# Patient Record
Sex: Female | Born: 1970
Health system: Southern US, Community
[De-identification: ages and names within clinical notes are randomized; demographics above are authoritative.]

## PROBLEM LIST (undated history)

## (undated) DIAGNOSIS — F329 Major depressive disorder, single episode, unspecified: Secondary | ICD-10-CM

## (undated) DIAGNOSIS — F32A Depression, unspecified: Secondary | ICD-10-CM

## (undated) DIAGNOSIS — J439 Emphysema, unspecified: Secondary | ICD-10-CM

## (undated) DIAGNOSIS — F410 Panic disorder [episodic paroxysmal anxiety] without agoraphobia: Secondary | ICD-10-CM

## (undated) DIAGNOSIS — R87629 Unspecified abnormal cytological findings in specimens from vagina: Secondary | ICD-10-CM

## (undated) DIAGNOSIS — F4310 Post-traumatic stress disorder, unspecified: Secondary | ICD-10-CM

## (undated) DIAGNOSIS — Z8619 Personal history of other infectious and parasitic diseases: Secondary | ICD-10-CM

## (undated) DIAGNOSIS — F419 Anxiety disorder, unspecified: Secondary | ICD-10-CM

## (undated) HISTORY — DX: Depression, unspecified: F32.A

## (undated) HISTORY — DX: Panic disorder (episodic paroxysmal anxiety): F41.0

## (undated) HISTORY — DX: Emphysema, unspecified: J43.9

## (undated) HISTORY — PX: NO PAST SURGERIES: SHX2092

## (undated) HISTORY — DX: Major depressive disorder, single episode, unspecified: F32.9

## (undated) HISTORY — DX: Anxiety disorder, unspecified: F41.9

## (undated) HISTORY — DX: Personal history of other infectious and parasitic diseases: Z86.19

## (undated) HISTORY — DX: Post-traumatic stress disorder, unspecified: F43.10

## (undated) HISTORY — DX: Unspecified abnormal cytological findings in specimens from vagina: R87.629

## (undated) HISTORY — PX: LEEP: SHX91

---

## 2016-01-18 ENCOUNTER — Ambulatory Visit: Payer: 59 | Admitting: Licensed Clinical Social Worker

## 2016-02-01 ENCOUNTER — Ambulatory Visit (INDEPENDENT_AMBULATORY_CARE_PROVIDER_SITE_OTHER): Payer: 59 | Admitting: Licensed Clinical Social Worker

## 2016-02-01 DIAGNOSIS — F331 Major depressive disorder, recurrent, moderate: Secondary | ICD-10-CM

## 2016-02-02 ENCOUNTER — Ambulatory Visit (INDEPENDENT_AMBULATORY_CARE_PROVIDER_SITE_OTHER): Payer: 59 | Admitting: Physician Assistant

## 2016-02-02 ENCOUNTER — Encounter: Payer: Self-pay | Admitting: Physician Assistant

## 2016-02-02 VITALS — BP 96/68 | HR 70 | Temp 98.3°F | Resp 16 | Ht 64.25 in | Wt 179.0 lb

## 2016-02-02 DIAGNOSIS — F430 Acute stress reaction: Secondary | ICD-10-CM | POA: Diagnosis not present

## 2016-02-02 MED ORDER — FLUOXETINE HCL 20 MG PO TABS: 20.0000 mg | ORAL_TABLET | Freq: Every day | ORAL | 3 refills | Status: DC

## 2016-02-02 MED ORDER — CLONAZEPAM 0.5 MG PO TABS: 0.5000 mg | ORAL_TABLET | Freq: Two times a day (BID) | ORAL | 1 refills | Status: DC | PRN

## 2016-02-02 NOTE — Progress Notes (Signed)
Patient presents to clinic today to establish care.  Patient with history of anxiety and depression, including a bout with postpartum depression > 15 years ago. Also with history of mild PTSD due to [place]. Is currently seeing a counselor. Has been doing well overall with counseling and without medication until recently. Notes last year losing her job which was hard to deal with. Felt depressed and stayed at home most of the time. Notes a 50 pound weight gain since then. Last month her husband of 22 years left her. Since then, mood has worsened. Is feeling depressed most day. Notes anhedonia and insomnia. Has increased smoking to 2.5-3 packs per day. No excess alcohol consumption. Denies SI/HI.   Past Medical History:  Diagnosis Date  . Anxiety   . Depression   . History of chicken pox   . Panic attacks   . PTSD (post-traumatic stress disorder)     Past Surgical History:  Procedure Laterality Date  . NO PAST SURGERIES      No current outpatient prescriptions on file prior to visit.   No current facility-administered medications on file prior to visit.     Allergies  Allergen Reactions  . Nsaids     Cause GI Issues in large amounts  . Penicillins Rash    Family History  Problem Relation Age of Onset  . Depression Mother     Living  . Diabetes Mother   . Hyperlipidemia Mother   . Hypertension Mother   . Heart attack Father     Living  . Heart disease Father   . Hyperlipidemia Father   . Hypertension Father   . Thyroid disease Sister   . Diabetes Maternal Grandmother   . Alcoholism Maternal Grandfather   . Breast cancer Paternal Grandmother     PGGM-Breast Cancer  . Breast cancer Paternal Aunt   . Heart attack Maternal Uncle   . Stroke Maternal Uncle   . Heart disease Maternal Uncle   . Ulcerative colitis Son     Age 4  . Depression Son     Age 33  . Healthy Neg Hx     Age 34    Social History   Social History    . Marital status: Divorced    Spouse name: N/A  . Number of children: N/A  . Years of education: N/A   Occupational History  . Not on file.   Social History Main Topics  . Smoking status: Current Every Day Smoker    Packs/day: 0.50    Years: 30.00    Types: Cigarettes  . Smokeless tobacco: Never Used  . Alcohol use Yes     Comment: social drinker -- 1-2 beers if out with friends  . Drug use: No  . Sexual activity: Not Currently   Other Topics Concern  . Not on file   Social History Narrative  . No narrative on file   Review of Systems  Constitutional: Negative for fever and weight loss.  HENT: Negative for ear discharge, ear pain, hearing loss and tinnitus.   Eyes: Negative for blurred vision, double vision, photophobia and pain.  Respiratory: Negative for cough and shortness of breath.   Cardiovascular: Negative for chest pain and palpitations.  Gastrointestinal: Negative for abdominal pain, blood in stool, constipation, diarrhea, heartburn, melena, nausea and vomiting.  Genitourinary: Negative for dysuria, flank pain, frequency, hematuria and urgency.  Musculoskeletal: Negative for falls.  Neurological: Negative for dizziness, loss of  consciousness and headaches.  Endo/Heme/Allergies: Negative for environmental allergies.  Psychiatric/Behavioral: Positive for depression. Negative for hallucinations, substance abuse and suicidal ideas. The patient is nervous/anxious. The patient does not have insomnia.     BP 96/68 (BP Location: Left Arm, Patient Position: Sitting, Cuff Size: Large)   Pulse 70   Temp 98.3 F (36.8 C) (Oral)   Resp 16   Ht 5' 4.25" (1.632 m)   Wt 179 lb (81.2 kg)   SpO2 98%   BMI 30.49 kg/m   Physical Exam  Constitutional: She is oriented to person, place, and time and well-developed, well-nourished, and in no distress.  HENT:  Head: Normocephalic.  Eyes: Conjunctivae are normal.  Neck: Neck supple.  Cardiovascular: Normal rate, regular  rhythm, normal heart sounds and intact distal pulses.   Pulmonary/Chest: Effort normal and breath sounds normal. No respiratory distress. She has no wheezes. She has no rales. She exhibits no tenderness.  Neurological: She is alert and oriented to person, place, and time.  Skin: Skin is warm and dry. No rash noted.  Psychiatric: Affect normal.  Vitals reviewed.  Assessment/Plan: Acute stress reaction Will continue counseling. Will begin Klonopin at low dose for panic attack only. Not to be used on a daily basis. Will begin Fluoxetine for ASR. Discussed this will take time to build up in system. ER precautions discussed. Follow-up 1 month.    Leeanne Rio, PA-C

## 2016-02-02 NOTE — Progress Notes (Signed)
Pre visit review using our clinic review tool, if applicable. No additional management support is needed unless otherwise documented below in the visit note/SLS  

## 2016-02-02 NOTE — Assessment & Plan Note (Signed)
Will continue counseling. Will begin Klonopin at low dose for panic attack only. Not to be used on a daily basis. Will begin Fluoxetine for ASR. Discussed this will take time to build up in system. ER precautions discussed. Follow-up 1 month.

## 2016-02-02 NOTE — Patient Instructions (Signed)
Please start the Prozac (Fluoxetine) taking 1/2 tablet daily x 3 days before increasing to 1 tablet daily. The Klonopin is to be used as directed for acute anxiety/panic attack.  Please continue appointments with [facility].  Follow-up with me in 4-6 weeks. If anything worsens while on medication, come see me immediately.

## 2016-02-08 ENCOUNTER — Ambulatory Visit (INDEPENDENT_AMBULATORY_CARE_PROVIDER_SITE_OTHER): Payer: 59 | Admitting: Licensed Clinical Social Worker

## 2016-02-08 DIAGNOSIS — F331 Major depressive disorder, recurrent, moderate: Secondary | ICD-10-CM

## 2016-02-20 ENCOUNTER — Ambulatory Visit (INDEPENDENT_AMBULATORY_CARE_PROVIDER_SITE_OTHER): Payer: 59 | Admitting: Physician Assistant

## 2016-02-20 ENCOUNTER — Encounter: Payer: Self-pay | Admitting: Physician Assistant

## 2016-02-20 VITALS — BP 98/72 | HR 77 | Temp 98.2°F | Resp 16 | Ht 64.25 in | Wt 169.4 lb

## 2016-02-20 DIAGNOSIS — F430 Acute stress reaction: Secondary | ICD-10-CM

## 2016-02-20 LAB — TSH: TSH: 1.1 u[IU]/mL (ref 0.35–4.50)

## 2016-02-20 MED ORDER — DIAZEPAM 10 MG PO TABS: 5.0000 mg | ORAL_TABLET | Freq: Two times a day (BID) | ORAL | 1 refills | Status: DC | PRN

## 2016-02-20 MED ORDER — CITALOPRAM HYDROBROMIDE 20 MG PO TABS: 20.0000 mg | ORAL_TABLET | Freq: Every day | ORAL | 3 refills | Status: DC

## 2016-02-20 NOTE — Patient Instructions (Signed)
Please go to the lab for blood work. I will call with results. Stop the Klonopin and fluoxetine immediately. Start taking the diazepam and citalopram as directed. I do feel that the fluoxetine was contributing to the worsening of her mood. If you do not notice an improvement of this medication, or if you had any recurrence of thoughts of harming yourself, please call 911 or proceed to the nearest emergency room.  I'm taking out of work over the next 2 weeks or you can relax. Stay surrounded by your friends as they seem like a wonderful support system. Heavy work fax over [employer] paperwork/short-term disability paperwork for me to fill out.  Follow-up with me in 2 weeks. Return sooner if needed.

## 2016-02-20 NOTE — Assessment & Plan Note (Signed)
Deteriorated with fluoxetine 20 mg daily. Has had some fleeting suicidal thoughts. Has good support system. No active plan or intent to harm herself. Concern fluoxetine may be contributing. We'll discontinue immediately. Has previously tried and failed Wellbutrin and Effexor with previous medical providers. We'll attempt trial citalopram 20 mg daily. Klonopin discontinued. We'll Rx Valium 10 mg twice daily while we wait for SSRI to reach a therapeutic effect. We'll check her thyroid functions today to make sure is not contributing. Patient has been taken out of work over the next 2 weeks that she can focus on relaxation and appointments with her counselor. Alarm signs and symptoms discussed with patient that would prompt immediate ER evaluation. Patient voices understanding and agreement with plan. Follow-up scheduled for 2 weeks.

## 2016-02-20 NOTE — Progress Notes (Signed)
Patient presents to clinic today for follow-up regarding acute stress reaction. At last visit, patient was started on fluoxetine 20 mg daily. Was also given prescription for Klonopin 0.5 mg to take as needed for acute anxiety only. Patient endorses taking medication as directed. Endorses the Klonopin is subtherapeutic for panic attacks. Endorses taking the Fluoxetine without any effect. Feels that mood has worsened. Having thoughts of harming herself over the past couple of days. Denies plan or intent to harm herself, only fleeting thoughts. Has a family to live for. Feels that medication is contributing to this. Denies thoughts of harming others. Feels that work is an extreme stressor right now as anxiety impacts job performance which is affecting her job security which further worsens her anxiety and mood. Endorses having a great support network of friends.   Past Medical History:  Diagnosis Date  . Anxiety   . Depression   . History of chicken pox   . Panic attacks   . PTSD (post-traumatic stress disorder)     No current outpatient prescriptions on file prior to visit.   No current facility-administered medications on file prior to visit.     Allergies  Allergen Reactions  . Nsaids     Cause GI Issues in large amounts  . Penicillins Rash    Family History  Problem Relation Age of Onset  . Depression Mother     Living  . Diabetes Mother   . Hyperlipidemia Mother   . Hypertension Mother   . Heart attack Father     Living  . Heart disease Father   . Hyperlipidemia Father   . Hypertension Father   . Thyroid disease Sister   . Diabetes Maternal Grandmother   . Alcoholism Maternal Grandfather   . Breast cancer Paternal Grandmother     PGGM-Breast Cancer  . Breast cancer Paternal Aunt   . Heart attack Maternal Uncle   . Stroke Maternal Uncle   . Heart disease Maternal Uncle   . Ulcerative colitis Son     Age 29  . Depression Son     Age 60  . Healthy Neg Hx     Age  27    Social History   Social History  . Marital status: Divorced    Spouse name: N/A  . Number of children: N/A  . Years of education: N/A   Social History Main Topics  . Smoking status: Current Every Day Smoker    Packs/day: 0.50    Years: 30.00    Types: Cigarettes  . Smokeless tobacco: Never Used  . Alcohol use Yes     Comment: social drinker -- 1-2 beers if out with friends  . Drug use: No  . Sexual activity: Not Currently   Other Topics Concern  . None   Social History Narrative  . None   Review of Systems - See HPI.  All other ROS are negative.  BP 98/72 (BP Location: Left Arm, Patient Position: Sitting, Cuff Size: Large)   Pulse 77   Temp 98.2 F (36.8 C) (Oral)   Resp 16   Ht 5' 4.25" (1.632 m)   Wt 169 lb 6 oz (76.8 kg)   SpO2 98%   BMI 28.85 kg/m   Physical Exam  Constitutional: She is oriented to person, place, and time and well-developed, well-nourished, and in no distress.  Eyes: Conjunctivae are normal.  Neck: Neck supple. No thyromegaly present.  Cardiovascular: Normal rate, regular rhythm, normal heart sounds and intact distal  pulses.   Pulmonary/Chest: Effort normal and breath sounds normal. No respiratory distress. She has no wheezes. She has no rales. She exhibits no tenderness.  Neurological: She is alert and oriented to person, place, and time.  Skin: Skin is warm and dry. No rash noted.  Psychiatric: Her mood appears anxious. Her affect is not labile. She exhibits a depressed mood. She expresses suicidal ideation. She expresses no homicidal ideation. She expresses no suicidal plans and no homicidal plans. She exhibits ordered thought content.  Vitals reviewed.  Assessment/Plan: Acute stress reaction Deteriorated with fluoxetine 20 mg daily. Has had some fleeting suicidal thoughts. Has good support system. No active plan or intent to harm herself. Concern fluoxetine may be contributing. We'll discontinue immediately. Has previously tried and  failed Wellbutrin and Effexor with previous medical providers. We'll attempt trial citalopram 20 mg daily. Klonopin discontinued. We'll Rx Valium 10 mg twice daily while we wait for SSRI to reach a therapeutic effect. We'll check her thyroid functions today to make sure is not contributing. Patient has been taken out of work over the next 2 weeks that she can focus on relaxation and appointments with her counselor. Alarm signs and symptoms discussed with patient that would prompt immediate ER evaluation. Patient voices understanding and agreement with plan. Follow-up scheduled for 2 weeks.    Leeanne Rio, PA-C

## 2016-02-20 NOTE — Addendum Note (Signed)
Addended by: Raiford Noble on: 02/20/2016 10:02 AM   Modules accepted: Orders

## 2016-02-22 ENCOUNTER — Ambulatory Visit (INDEPENDENT_AMBULATORY_CARE_PROVIDER_SITE_OTHER): Payer: 59 | Admitting: Licensed Clinical Social Worker

## 2016-02-22 DIAGNOSIS — F331 Major depressive disorder, recurrent, moderate: Secondary | ICD-10-CM

## 2016-02-27 ENCOUNTER — Telehealth: Payer: Self-pay | Admitting: Physician Assistant

## 2016-02-28 NOTE — Telephone Encounter (Signed)
Spoke with Angie [paperwork] to inquire about this issue; she stated that all paperwork regarding FMLA/Disability is forwarded straight to [place], clinic lead], and upon speaking with Ashlee about this matter, they cannot locate patient's paperwork. Due to the Urgency of this matter, I have spoken with patient to ask if she can contact her Insurance facilitator and request to have another copy for PCP and Counselor [Julie] faxed to me in the clinic at [phone], so that we may get this matter completed in a timely manner. Patient stated that her Insurance, [employer], reports that they have called our office several times about this matter without response; apologized to patient and reiterated to have the faxes sent to the clinic at me Attention and will will take care of our part of the paperwork needed, and that I will forward Julie's to her box in front office with a note of the urgency on it/SLS 08/23

## 2016-02-28 NOTE — Telephone Encounter (Signed)
Personally took paperwork off of the fax machine. Did not see any documents for Hannah Keith to fill out -- We need to keep a check on this.  I did fill out all of the forms addressed to me.  I have faxed in to [facility] and have spoken with the person handling her case so they are aware fax should be delivered here shortly.   Patient notified.  Please check with Hannah Keith to see if she has received any forms.

## 2016-02-28 NOTE — Telephone Encounter (Signed)
Pt came in the office to follow up on her FMLA paperwork, pt states that insurance company informed her that the paperwork was faxed to [place] and [facility] last week, states that they informed her that there was never a response from our office. Pt states that if the paperwork is not faxed back by Friday of this week, the claim will be denied and she will not get paid for 2 weeks. Pt would like a call back regarding this matter as soon as possible

## 2016-03-05 ENCOUNTER — Encounter: Payer: Self-pay | Admitting: Physician Assistant

## 2016-03-05 ENCOUNTER — Ambulatory Visit (INDEPENDENT_AMBULATORY_CARE_PROVIDER_SITE_OTHER): Payer: 59 | Admitting: Physician Assistant

## 2016-03-05 DIAGNOSIS — F430 Acute stress reaction: Secondary | ICD-10-CM

## 2016-03-05 NOTE — Progress Notes (Signed)
Patient presents to clinic today for follow-up of acute stress reaction after beginning Citalopram 20 mg daily. Patient endorses taking medication as directed. Endorses improvement in her overall mood. Endorses anxiety is still present but is mostly manageable. Frequency of panic attacks is decreased. Panic attacks are well managed with diet. Main concern is sleep at present. Patient is having a hard time staying asleep. Once to discuss options, but does not want to start any medication if possible. Patient also concerned about social stressors as her return to work date is tomorrow. Is wanting to discuss potential to extend her disability period.   Past Medical History:  Diagnosis Date  . Anxiety   . Depression   . History of chicken pox   . Panic attacks   . PTSD (post-traumatic stress disorder)     Current Outpatient Prescriptions on File Prior to Visit  Medication Sig Dispense Refill  . citalopram (CELEXA) 20 MG tablet Take 1 tablet (20 mg total) by mouth daily. 30 tablet 3  . diazepam (VALIUM) 10 MG tablet Take 0.5-1 tablets (5-10 mg total) by mouth every 12 (twelve) hours as needed for anxiety. 60 tablet 1   No current facility-administered medications on file prior to visit.     Allergies  Allergen Reactions  . Nsaids     Cause GI Issues in large amounts  . Penicillins Rash    Family History  Problem Relation Age of Onset  . Depression Mother     Living  . Diabetes Mother   . Hyperlipidemia Mother   . Hypertension Mother   . Heart attack Father     Living  . Heart disease Father   . Hyperlipidemia Father   . Hypertension Father   . Thyroid disease Sister   . Diabetes Maternal Grandmother   . Alcoholism Maternal Grandfather   . Breast cancer Paternal Grandmother     PGGM-Breast Cancer  . Breast cancer Paternal Aunt   . Heart attack Maternal Uncle   . Stroke Maternal Uncle   . Heart disease Maternal Uncle   . Ulcerative colitis Son     Age 46  . Depression  Son     Age 53  . Healthy Neg Hx     Age 4    Social History   Social History  . Marital status: Divorced    Spouse name: N/A  . Number of children: N/A  . Years of education: N/A   Social History Main Topics  . Smoking status: Current Every Day Smoker    Packs/day: 0.50    Years: 30.00    Types: Cigarettes  . Smokeless tobacco: Never Used  . Alcohol use Yes     Comment: social drinker -- 1-2 beers if out with friends  . Drug use: No  . Sexual activity: Not Currently   Other Topics Concern  . None   Social History Narrative  . None   Review of Systems - See HPI.  All other ROS are negative.  BP 98/60 (BP Location: Left Arm, Patient Position: Sitting, Cuff Size: Large)   Pulse 76   Temp 98.2 F (36.8 C) (Oral)   Resp 16   Ht 5\' 4"  (1.626 m)   Wt 164 lb 8 oz (74.6 kg)   SpO2 98%   BMI 28.24 kg/m   Physical Exam  Constitutional: She is oriented to person, place, and time and well-developed, well-nourished, and in no distress.  Cardiovascular: Normal rate, regular rhythm, normal heart sounds and intact  distal pulses.   Pulmonary/Chest: Effort normal.  Neurological: She is alert and oriented to person, place, and time.  Skin: Skin is warm and dry. No rash noted.  Psychiatric: Affect normal.  Vitals reviewed.   Recent Results (from the past 2160 hour(s))  TSH     Status: None   Collection Time: 02/20/16 10:02 AM  Result Value Ref Range   TSH 1.10 0.35 - 4.50 uIU/mL   Assessment/Plan: Acute stress reaction Is doing much better after switch from Prozac to citalopram. Has been on citalopram 2 weeks with noted improvement. Will continue same dose for now. Continue Valium as directed when needed for acute anxiety. Continue counseling. Will start melatonin nightly to help with sleep. Sleep hygiene and exercise recommendations reviewed with patient. Patient has scheduled physical in 2 weeks. I have agreed to extend her short-term disability leave for one more week,  to allow time for further symptom improvement before releasing her back to stressors at work    [employer], Sandria Manly, PA-C

## 2016-03-05 NOTE — Assessment & Plan Note (Addendum)
Is doing much better after switch from Prozac to citalopram. Has been on citalopram 2 weeks with noted improvement. Will continue same dose for now. Continue Valium as directed when needed for acute anxiety. Continue counseling. Will start melatonin nightly to help with sleep. Sleep hygiene and exercise recommendations reviewed with patient. Patient has scheduled physical in 2 weeks. I have agreed to extend her short-term disability leave for one more week, to allow time for further symptom improvement before releasing her back to stressors at work. Will extend leave 1 more week with return to work date now 03/13/16. FU scheduled for 03/15/16

## 2016-03-05 NOTE — Patient Instructions (Signed)
Please continue Citalopram as directed. Use Valium when needed for panic attack. Start an over-the-counter Melatonin supplement 3-5 mg nightly to help with sleep. Increase evening exercise to promote restful sleep. Start a daily Gatorade.  I will see you at your physical.

## 2016-03-07 ENCOUNTER — Ambulatory Visit (INDEPENDENT_AMBULATORY_CARE_PROVIDER_SITE_OTHER): Payer: 59 | Admitting: Licensed Clinical Social Worker

## 2016-03-07 DIAGNOSIS — F331 Major depressive disorder, recurrent, moderate: Secondary | ICD-10-CM

## 2016-03-14 NOTE — Telephone Encounter (Addendum)
Spoke with Hannah Keith and she stated that she has seen the pt and the pt informed her that the paperwork that [place] filled out for her was enough.  So Hannah Keith will not need to fill out any forms per the pt.//AB/CMA

## 2016-03-15 ENCOUNTER — Encounter: Payer: Self-pay | Admitting: Physician Assistant

## 2016-03-15 ENCOUNTER — Ambulatory Visit (INDEPENDENT_AMBULATORY_CARE_PROVIDER_SITE_OTHER): Payer: 59 | Admitting: Physician Assistant

## 2016-03-15 VITALS — BP 92/56 | HR 67 | Temp 98.3°F | Resp 16 | Ht 64.0 in | Wt 164.1 lb

## 2016-03-15 DIAGNOSIS — Z1239 Encounter for other screening for malignant neoplasm of breast: Secondary | ICD-10-CM | POA: Diagnosis not present

## 2016-03-15 DIAGNOSIS — Z23 Encounter for immunization: Secondary | ICD-10-CM

## 2016-03-15 DIAGNOSIS — R42 Dizziness and giddiness: Secondary | ICD-10-CM | POA: Diagnosis not present

## 2016-03-15 DIAGNOSIS — Z0000 Encounter for general adult medical examination without abnormal findings: Secondary | ICD-10-CM

## 2016-03-15 DIAGNOSIS — F430 Acute stress reaction: Secondary | ICD-10-CM

## 2016-03-15 DIAGNOSIS — Z01419 Encounter for gynecological examination (general) (routine) without abnormal findings: Secondary | ICD-10-CM | POA: Diagnosis not present

## 2016-03-15 LAB — URINALYSIS, ROUTINE W REFLEX MICROSCOPIC
Bilirubin Urine: NEGATIVE
HGB URINE DIPSTICK: NEGATIVE
Ketones, ur: NEGATIVE
Leukocytes, UA: NEGATIVE
NITRITE: NEGATIVE
RBC / HPF: NONE SEEN (ref 0–?)
Specific Gravity, Urine: <=1.005 — AB (ref 1.000–1.030)
Total Protein, Urine: NEGATIVE
Urine Glucose: NEGATIVE
Urobilinogen, UA: 0.2 (ref 0.0–1.0)
WBC UA: NONE SEEN (ref 0–?)
pH: 6.5 (ref 5.0–8.0)

## 2016-03-15 LAB — COMPREHENSIVE METABOLIC PANEL
ALT: 14 U/L (ref 0–35)
AST: 11 U/L (ref 0–37)
Albumin: 4 g/dL (ref 3.5–5.2)
Alkaline Phosphatase: 62 U/L (ref 39–117)
BILIRUBIN TOTAL: 0.5 mg/dL (ref 0.2–1.2)
BUN: 7 mg/dL (ref 6–23)
CALCIUM: 9.1 mg/dL (ref 8.4–10.5)
CHLORIDE: 108 meq/L (ref 96–112)
CO2: 29 meq/L (ref 19–32)
Creatinine, Ser: 0.81 mg/dL (ref 0.40–1.20)
GFR: 80.45 mL/min (ref >60.00)
Glucose, Bld: 101 mg/dL — ABNORMAL HIGH (ref 70–99)
Potassium: 4 mEq/L (ref 3.5–5.1)
Sodium: 141 mEq/L (ref 135–145)
Total Protein: 6.5 g/dL (ref 6.0–8.3)

## 2016-03-15 LAB — LIPID PANEL
CHOLESTEROL: 165 mg/dL (ref 0–200)
HDL: 40 mg/dL (ref >39.00)
LDL CALC: 109 mg/dL — AB (ref 0–99)
NonHDL: 124.69
TRIGLYCERIDES: 76 mg/dL (ref 0.0–149.0)
Total CHOL/HDL Ratio: 4
VLDL: 15.2 mg/dL (ref 0.0–40.0)

## 2016-03-15 LAB — HEMOGLOBIN A1C: Hgb A1c MFr Bld: 5.7 % (ref 4.6–6.5)

## 2016-03-15 LAB — CBC
HCT: 43.7 % (ref 36.0–46.0)
HEMOGLOBIN: 14.7 g/dL (ref 12.0–15.0)
MCHC: 33.6 g/dL (ref 30.0–36.0)
MCV: 93.2 fl (ref 78.0–100.0)
PLATELETS: 220 10*3/uL (ref 150.0–400.0)
RBC: 4.69 Mil/uL (ref 3.87–5.11)
RDW: 15.7 % — ABNORMAL HIGH (ref 11.5–15.5)
WBC: 6.4 10*3/uL (ref 4.0–10.5)

## 2016-03-15 MED ORDER — CITALOPRAM HYDROBROMIDE 40 MG PO TABS: 40.0000 mg | ORAL_TABLET | Freq: Every day | ORAL | 3 refills | Status: DC

## 2016-03-15 MED ORDER — CLONAZEPAM 0.5 MG PO TABS: 0.5000 mg | ORAL_TABLET | Freq: Two times a day (BID) | ORAL | 1 refills | Status: DC | PRN

## 2016-03-15 NOTE — Assessment & Plan Note (Signed)
Referral to GYN placed for routine care.

## 2016-03-15 NOTE — Assessment & Plan Note (Signed)
Will increase Citalopram to 40 mg daily. Will d/c Valium and start Klonopin for acute anxiety. FU 3 months. Will work on intermittent FMLA.

## 2016-03-15 NOTE — Assessment & Plan Note (Signed)
Depression screen negative. Health Maintenance reviewed -- Flu shot updated. She defers TD today. Referral to GYN for routine pelvic exam and pap smear.  Preventive schedule discussed and handout given in AVS. Will obtain fasting labs today.

## 2016-03-15 NOTE — Patient Instructions (Signed)
Please start the new dose of Citalopram. Stop the Valium. Use Klonopin as directed if needed for panic attack.  Follow-up with me in 3 months.  Please go to the lab for blood work.   Our office will call you with your results unless you have chosen to receive results via MyChart.  If your blood work is normal we will follow-up each year for physicals and as scheduled for chronic medical problems.  If anything is abnormal we will treat accordingly and get you in for a follow-up.   Preventive Care for Adults, Female A healthy lifestyle and preventive care can promote health and wellness. Preventive health guidelines for women include the following key practices.  A routine yearly physical is a good way to check with your health care provider about your health and preventive screening. It is a chance to share any concerns and updates on your health and to receive a thorough exam.  Visit your dentist for a routine exam and preventive care every 6 months. Brush your teeth twice a day and floss once a day. Good oral hygiene prevents tooth decay and gum disease.  The frequency of eye exams is based on your age, health, family medical history, use of contact lenses, and other factors. Follow your health care provider's recommendations for frequency of eye exams.  Eat a healthy diet. Foods like vegetables, fruits, whole grains, low-fat dairy products, and lean protein foods contain the nutrients you need without too many calories. Decrease your intake of foods high in solid fats, added sugars, and salt. Eat the right amount of calories for you.Get information about a proper diet from your health care provider, if necessary.  Regular physical exercise is one of the most important things you can do for your health. Most adults should get at least 150 minutes of moderate-intensity exercise (any activity that increases your heart rate and causes you to sweat) each week. In addition, most adults need  muscle-strengthening exercises on 2 or more days a week.  Maintain a healthy weight. The body mass index (BMI) is a screening tool to identify possible weight problems. It provides an estimate of body fat based on height and weight. Your health care provider can find your BMI and can help you achieve or maintain a healthy weight.For adults 20 years and older:  A BMI below 18.5 is considered underweight.  A BMI of 18.5 to 24.9 is normal.  A BMI of 25 to 29.9 is considered overweight.  A BMI of 30 and above is considered obese.  Maintain normal blood lipids and cholesterol levels by exercising and minimizing your intake of saturated fat. Eat a balanced diet with plenty of fruit and vegetables. Blood tests for lipids and cholesterol should begin at age 36 and be repeated every 5 years. If your lipid or cholesterol levels are high, you are over 50, or you are at high risk for heart disease, you may need your cholesterol levels checked more frequently.Ongoing high lipid and cholesterol levels should be treated with medicines if diet and exercise are not working.  If you smoke, find out from your health care provider how to quit. If you do not use tobacco, do not start.  Lung cancer screening is recommended for adults aged 12-80 years who are at high risk for developing lung cancer because of a history of smoking. A yearly low-dose CT scan of the lungs is recommended for people who have at least a 30-pack-year history of smoking and are a current  smoker or have quit within the past 15 years. A pack year of smoking is smoking an average of 1 pack of cigarettes a day for 1 year (for example: 1 pack a day for 30 years or 2 packs a day for 15 years). Yearly screening should continue until the smoker has stopped smoking for at least 15 years. Yearly screening should be stopped for people who develop a health problem that would prevent them from having lung cancer treatment.  If you are pregnant, do not  drink alcohol. If you are breastfeeding, be very cautious about drinking alcohol. If you are not pregnant and choose to drink alcohol, do not have more than 1 drink per day. One drink is considered to be 12 ounces (355 mL) of beer, 5 ounces (148 mL) of wine, or 1.5 ounces (44 mL) of liquor.  Avoid use of street drugs. Do not share needles with anyone. Ask for help if you need support or instructions about stopping the use of drugs.  High blood pressure causes heart disease and increases the risk of stroke. Your blood pressure should be checked at least every 1 to 2 years. Ongoing high blood pressure should be treated with medicines if weight loss and exercise do not work.  If you are 4-54 years old, ask your health care provider if you should take aspirin to prevent strokes.  Diabetes screening is done by taking a blood sample to check your blood glucose level after you have not eaten for a certain period of time (fasting). If you are not overweight and you do not have risk factors for diabetes, you should be screened once every 3 years starting at age 34. If you are overweight or obese and you are 58-66 years of age, you should be screened for diabetes every year as part of your cardiovascular risk assessment.  Breast cancer screening is essential preventive care for women. You should practice "breast self-awareness." This means understanding the normal appearance and feel of your breasts and may include breast self-examination. Any changes detected, no matter how small, should be reported to a health care provider. Women in their 73s and 30s should have a clinical breast exam (CBE) by a health care provider as part of a regular health exam every 1 to 3 years. After age 61, women should have a CBE every year. Starting at age 64, women should consider having a mammogram (breast X-ray test) every year. Women who have a family history of breast cancer should talk to their health care provider about genetic  screening. Women at a high risk of breast cancer should talk to their health care providers about having an MRI and a mammogram every year.  Breast cancer gene (BRCA)-related cancer risk assessment is recommended for women who have family members with BRCA-related cancers. BRCA-related cancers include breast, ovarian, tubal, and peritoneal cancers. Having family members with these cancers may be associated with an increased risk for harmful changes (mutations) in the breast cancer genes BRCA1 and BRCA2. Results of the assessment will determine the need for genetic counseling and BRCA1 and BRCA2 testing.  Your health care provider may recommend that you be screened regularly for cancer of the pelvic organs (ovaries, uterus, and vagina). This screening involves a pelvic examination, including checking for microscopic changes to the surface of your cervix (Pap test). You may be encouraged to have this screening done every 3 years, beginning at age 71.  For women ages 80-65, health care providers may recommend pelvic exams  and Pap testing every 3 years, or they may recommend the Pap and pelvic exam, combined with testing for human papilloma virus (HPV), every 5 years. Some types of HPV increase your risk of cervical cancer. Testing for HPV may also be done on women of any age with unclear Pap test results.  Other health care providers may not recommend any screening for nonpregnant women who are considered low risk for pelvic cancer and who do not have symptoms. Ask your health care provider if a screening pelvic exam is right for you.  If you have had past treatment for cervical cancer or a condition that could lead to cancer, you need Pap tests and screening for cancer for at least 20 years after your treatment. If Pap tests have been discontinued, your risk factors (such as having a new sexual partner) need to be reassessed to determine if screening should resume. Some women have medical problems that  increase the chance of getting cervical cancer. In these cases, your health care provider may recommend more frequent screening and Pap tests.  Colorectal cancer can be detected and often prevented. Most routine colorectal cancer screening begins at the age of 21 years and continues through age 38 years. However, your health care provider may recommend screening at an earlier age if you have risk factors for colon cancer. On a yearly basis, your health care provider may provide home test kits to check for hidden blood in the stool. Use of a small camera at the end of a tube, to directly examine the colon (sigmoidoscopy or colonoscopy), can detect the earliest forms of colorectal cancer. Talk to your health care provider about this at age 56, when routine screening begins. Direct exam of the colon should be repeated every 5-10 years through age 83 years, unless early forms of precancerous polyps or small growths are found.  People who are at an increased risk for hepatitis B should be screened for this virus. You are considered at high risk for hepatitis B if:  You were born in a country where hepatitis B occurs often. Talk with your health care provider about which countries are considered high risk.  Your parents were born in a high-risk country and you have not received a shot to protect against hepatitis B (hepatitis B vaccine).  You have HIV or AIDS.  You use needles to inject street drugs.  You live with, or have sex with, someone who has hepatitis B.  You get hemodialysis treatment.  You take certain medicines for conditions like cancer, organ transplantation, and autoimmune conditions.  Hepatitis C blood testing is recommended for all people born from [birth year] through 1967 and any individual with known risks for hepatitis C.  Practice safe sex. Use condoms and avoid high-risk sexual practices to reduce the spread of sexually transmitted infections (STIs). STIs include gonorrhea, chlamydia,  syphilis, trichomonas, herpes, HPV, and human immunodeficiency virus (HIV). Herpes, HIV, and HPV are viral illnesses that have no cure. They can result in disability, cancer, and death.  You should be screened for sexually transmitted illnesses (STIs) including gonorrhea and chlamydia if:  You are sexually active and are younger than 24 years.  You are older than 24 years and your health care provider tells you that you are at risk for this type of infection.  Your sexual activity has changed since you were last screened and you are at an increased risk for chlamydia or gonorrhea. Ask your health care provider if you are at risk.  If you are at risk of being infected with HIV, it is recommended that you take a prescription medicine daily to prevent HIV infection. This is called preexposure prophylaxis (PrEP). You are considered at risk if:  You are sexually active and do not regularly use condoms or know the HIV status of your partner(s).  You take drugs by injection.  You are sexually active with a partner who has HIV.  Talk with your health care provider about whether you are at high risk of being infected with HIV. If you choose to begin PrEP, you should first be tested for HIV. You should then be tested every 3 months for as long as you are taking PrEP.  Osteoporosis is a disease in which the bones lose minerals and strength with aging. This can result in serious bone fractures or breaks. The risk of osteoporosis can be identified using a bone density scan. Women ages 66 years and over and women at risk for fractures or osteoporosis should discuss screening with their health care providers. Ask your health care provider whether you should take a calcium supplement or vitamin D to reduce the rate of osteoporosis.  Menopause can be associated with physical symptoms and risks. Hormone replacement therapy is available to decrease symptoms and risks. You should talk to your health care provider  about whether hormone replacement therapy is right for you.  Use sunscreen. Apply sunscreen liberally and repeatedly throughout the day. You should seek shade when your shadow is shorter than you. Protect yourself by wearing long sleeves, pants, a wide-brimmed hat, and sunglasses year round, whenever you are outdoors.  Once a month, do a whole body skin exam, using a mirror to look at the skin on your back. Tell your health care provider of new moles, moles that have irregular borders, moles that are larger than a pencil eraser, or moles that have changed in shape or color.  Stay current with required vaccines (immunizations).  Influenza vaccine. All adults should be immunized every year.  Tetanus, diphtheria, and acellular pertussis (Td, Tdap) vaccine. Pregnant women should receive 1 dose of Tdap vaccine during each pregnancy. The dose should be obtained regardless of the length of time since the last dose. Immunization is preferred during the 27th-36th week of gestation. An adult who has not previously received Tdap or who does not know her vaccine status should receive 1 dose of Tdap. This initial dose should be followed by tetanus and diphtheria toxoids (Td) booster doses every 10 years. Adults with an unknown or incomplete history of completing a 3-dose immunization series with Td-containing vaccines should begin or complete a primary immunization series including a Tdap dose. Adults should receive a Td booster every 10 years.  Varicella vaccine. An adult without evidence of immunity to varicella should receive 2 doses or a second dose if she has previously received 1 dose. Pregnant females who do not have evidence of immunity should receive the first dose after pregnancy. This first dose should be obtained before leaving the health care facility. The second dose should be obtained 4-8 weeks after the first dose.  Human papillomavirus (HPV) vaccine. Females aged 13-26 years who have not received  the vaccine previously should obtain the 3-dose series. The vaccine is not recommended for use in pregnant females. However, pregnancy testing is not needed before receiving a dose. If a female is found to be pregnant after receiving a dose, no treatment is needed. In that case, the remaining doses should be delayed until  after the pregnancy. Immunization is recommended for any person with an immunocompromised condition through the age of 29 years if she did not get any or all doses earlier. During the 3-dose series, the second dose should be obtained 4-8 weeks after the first dose. The third dose should be obtained 24 weeks after the first dose and 16 weeks after the second dose.  Zoster vaccine. One dose is recommended for adults aged 57 years or older unless certain conditions are present.  Measles, mumps, and rubella (MMR) vaccine. Adults born before 2 generally are considered immune to measles and mumps. Adults born in [birth year] or later should have 1 or more doses of MMR vaccine unless there is a contraindication to the vaccine or there is laboratory evidence of immunity to each of the three diseases. A routine second dose of MMR vaccine should be obtained at least 28 days after the first dose for students attending postsecondary schools, health care workers, or international travelers. People who received inactivated measles vaccine or an unknown type of measles vaccine during 1963-1967 should receive 2 doses of MMR vaccine. People who received inactivated mumps vaccine or an unknown type of mumps vaccine before 1979 and are at high risk for mumps infection should consider immunization with 2 doses of MMR vaccine. For females of childbearing age, rubella immunity should be determined. If there is no evidence of immunity, females who are not pregnant should be vaccinated. If there is no evidence of immunity, females who are pregnant should delay immunization until after pregnancy. Unvaccinated health care  workers born before 45 who lack laboratory evidence of measles, mumps, or rubella immunity or laboratory confirmation of disease should consider measles and mumps immunization with 2 doses of MMR vaccine or rubella immunization with 1 dose of MMR vaccine.  Pneumococcal 13-valent conjugate (PCV13) vaccine. When indicated, a person who is uncertain of his immunization history and has no record of immunization should receive the PCV13 vaccine. All adults 26 years of age and older should receive this vaccine. An adult aged 45 years or older who has certain medical conditions and has not been previously immunized should receive 1 dose of PCV13 vaccine. This PCV13 should be followed with a dose of pneumococcal polysaccharide (PPSV23) vaccine. Adults who are at high risk for pneumococcal disease should obtain the PPSV23 vaccine at least 8 weeks after the dose of PCV13 vaccine. Adults older than 47 years of age who have normal immune system function should obtain the PPSV23 vaccine dose at least 1 year after the dose of PCV13 vaccine.  Pneumococcal polysaccharide (PPSV23) vaccine. When PCV13 is also indicated, PCV13 should be obtained first. All adults aged 3 years and older should be immunized. An adult younger than age 18 years who has certain medical conditions should be immunized. Any person who resides in a nursing home or long-term care facility should be immunized. An adult smoker should be immunized. People with an immunocompromised condition and certain other conditions should receive both PCV13 and PPSV23 vaccines. People with human immunodeficiency virus (HIV) infection should be immunized as soon as possible after diagnosis. Immunization during chemotherapy or radiation therapy should be avoided. Routine use of PPSV23 vaccine is not recommended for American Indians, [place] Natives, or people younger than 65 years unless there are medical conditions that require PPSV23 vaccine. When indicated, people who  have unknown immunization and have no record of immunization should receive PPSV23 vaccine. One-time revaccination 5 years after the first dose of PPSV23 is recommended for people  aged 19-64 years who have chronic kidney failure, nephrotic syndrome, asplenia, or immunocompromised conditions. People who received 1-2 doses of PPSV23 before age 74 years should receive another dose of PPSV23 vaccine at age 35 years or later if at least 5 years have passed since the previous dose. Doses of PPSV23 are not needed for people immunized with PPSV23 at or after age 74 years.  Meningococcal vaccine. Adults with asplenia or persistent complement component deficiencies should receive 2 doses of quadrivalent meningococcal conjugate (MenACWY-D) vaccine. The doses should be obtained at least 2 months apart. Microbiologists working with certain meningococcal bacteria, Farmers recruits, people at risk during an outbreak, and people who travel to or live in countries with a high rate of meningitis should be immunized. A first-year college student up through age 49 years who is living in a residence hall should receive a dose if she did not receive a dose on or after her 16th birthday. Adults who have certain high-risk conditions should receive one or more doses of vaccine.  Hepatitis A vaccine. Adults who wish to be protected from this disease, have certain high-risk conditions, work with hepatitis A-infected animals, work in hepatitis A research labs, or travel to or work in countries with a high rate of hepatitis A should be immunized. Adults who were previously unvaccinated and who anticipate close contact with an international adoptee during the first 60 days after arrival in the [place] from a country with a high rate of hepatitis A should be immunized.  Hepatitis B vaccine. Adults who wish to be protected from this disease, have certain high-risk conditions, may be exposed to blood or other infectious body fluids,  are household contacts or sex partners of hepatitis B positive people, are clients or workers in certain care facilities, or travel to or work in countries with a high rate of hepatitis B should be immunized.  Haemophilus influenzae type b (Hib) vaccine. A previously unvaccinated person with asplenia or sickle cell disease or having a scheduled splenectomy should receive 1 dose of Hib vaccine. Regardless of previous immunization, a recipient of a hematopoietic stem cell transplant should receive a 3-dose series 6-12 months after her successful transplant. Hib vaccine is not recommended for adults with HIV infection. Preventive Services / Frequency Ages 44 to 21 years  Blood pressure check.** / Every 3-5 years.  Lipid and cholesterol check.** / Every 5 years beginning at age 5.  Clinical breast exam.** / Every 3 years for women in their 31s and 70s.  BRCA-related cancer risk assessment.** / For women who have family members with a BRCA-related cancer (breast, ovarian, tubal, or peritoneal cancers).  Pap test.** / Every 2 years from ages 73 through 64. Every 3 years starting at age 24 through age 24 or 39 with a history of 3 consecutive normal Pap tests.  HPV screening.** / Every 3 years from ages 110 through ages 31 to 20 with a history of 3 consecutive normal Pap tests.  Hepatitis C blood test.** / For any individual with known risks for hepatitis C.  Skin self-exam. / Monthly.  Influenza vaccine. / Every year.  Tetanus, diphtheria, and acellular pertussis (Tdap, Td) vaccine.** / Consult your health care provider. Pregnant women should receive 1 dose of Tdap vaccine during each pregnancy. 1 dose of Td every 10 years.  Varicella vaccine.** / Consult your health care provider. Pregnant females who do not have evidence of immunity should receive the first dose after pregnancy.  HPV vaccine. / 3 doses  over 6 months, if 88 and younger. The vaccine is not recommended for use in pregnant  females. However, pregnancy testing is not needed before receiving a dose.  Measles, mumps, rubella (MMR) vaccine.** / You need at least 1 dose of MMR if you were born in 1957 or later. You may also need a 2nd dose. For females of childbearing age, rubella immunity should be determined. If there is no evidence of immunity, females who are not pregnant should be vaccinated. If there is no evidence of immunity, females who are pregnant should delay immunization until after pregnancy.  Pneumococcal 13-valent conjugate (PCV13) vaccine.** / Consult your health care provider.  Pneumococcal polysaccharide (PPSV23) vaccine.** / 1 to 2 doses if you smoke cigarettes or if you have certain conditions.  Meningococcal vaccine.** / 1 dose if you are age 16 to 89 years and a Market researcher living in a residence hall, or have one of several medical conditions, you need to get vaccinated against meningococcal disease. You may also need additional booster doses.  Hepatitis A vaccine.** / Consult your health care provider.  Hepatitis B vaccine.** / Consult your health care provider.  Haemophilus influenzae type b (Hib) vaccine.** / Consult your health care provider. Ages 10 to 28 years  Blood pressure check.** / Every year.  Lipid and cholesterol check.** / Every 5 years beginning at age 22 years.  Lung cancer screening. / Every year if you are aged 54-80 years and have a 30-pack-year history of smoking and currently smoke or have quit within the past 15 years. Yearly screening is stopped once you have quit smoking for at least 15 years or develop a health problem that would prevent you from having lung cancer treatment.  Clinical breast exam.** / Every year after age 23 years.  BRCA-related cancer risk assessment.** / For women who have family members with a BRCA-related cancer (breast, ovarian, tubal, or peritoneal cancers).  Mammogram.** / Every year beginning at age 56 years and continuing  for as long as you are in good health. Consult with your health care provider.  Pap test.** / Every 3 years starting at age 10 years through age 20 or 14 years with a history of 3 consecutive normal Pap tests.  HPV screening.** / Every 3 years from ages 16 years through ages 8 to 13 years with a history of 3 consecutive normal Pap tests.  Fecal occult blood test (FOBT) of stool. / Every year beginning at age 94 years and continuing until age 58 years. You may not need to do this test if you get a colonoscopy every 10 years.  Flexible sigmoidoscopy or colonoscopy.** / Every 5 years for a flexible sigmoidoscopy or every 10 years for a colonoscopy beginning at age 11 years and continuing until age 72 years.  Hepatitis C blood test.** / For all people born from [birth year] through 1965 and any individual with known risks for hepatitis C.  Skin self-exam. / Monthly.  Influenza vaccine. / Every year.  Tetanus, diphtheria, and acellular pertussis (Tdap/Td) vaccine.** / Consult your health care provider. Pregnant women should receive 1 dose of Tdap vaccine during each pregnancy. 1 dose of Td every 10 years.  Varicella vaccine.** / Consult your health care provider. Pregnant females who do not have evidence of immunity should receive the first dose after pregnancy.  Zoster vaccine.** / 1 dose for adults aged 60 years or older.  Measles, mumps, rubella (MMR) vaccine.** / You need at least 1 dose of MMR if  you were born in 1957 or later. You may also need a second dose. For females of childbearing age, rubella immunity should be determined. If there is no evidence of immunity, females who are not pregnant should be vaccinated. If there is no evidence of immunity, females who are pregnant should delay immunization until after pregnancy.  Pneumococcal 13-valent conjugate (PCV13) vaccine.** / Consult your health care provider.  Pneumococcal polysaccharide (PPSV23) vaccine.** / 1 to 2 doses if you smoke  cigarettes or if you have certain conditions.  Meningococcal vaccine.** / Consult your health care provider.  Hepatitis A vaccine.** / Consult your health care provider.  Hepatitis B vaccine.** / Consult your health care provider.  Haemophilus influenzae type b (Hib) vaccine.** / Consult your health care provider. Ages 65 years and over  Blood pressure check.** / Every year.  Lipid and cholesterol check.** / Every 5 years beginning at age 20 years.  Lung cancer screening. / Every year if you are aged 55-80 years and have a 30-pack-year history of smoking and currently smoke or have quit within the past 15 years. Yearly screening is stopped once you have quit smoking for at least 15 years or develop a health problem that would prevent you from having lung cancer treatment.  Clinical breast exam.** / Every year after age 40 years.  BRCA-related cancer risk assessment.** / For women who have family members with a BRCA-related cancer (breast, ovarian, tubal, or peritoneal cancers).  Mammogram.** / Every year beginning at age 40 years and continuing for as long as you are in good health. Consult with your health care provider.  Pap test.** / Every 3 years starting at age 30 years through age 65 or 70 years with 3 consecutive normal Pap tests. Testing can be stopped between 65 and 70 years with 3 consecutive normal Pap tests and no abnormal Pap or HPV tests in the past 10 years.  HPV screening.** / Every 3 years from ages 30 years through ages 65 or 70 years with a history of 3 consecutive normal Pap tests. Testing can be stopped between 65 and 70 years with 3 consecutive normal Pap tests and no abnormal Pap or HPV tests in the past 10 years.  Fecal occult blood test (FOBT) of stool. / Every year beginning at age 50 years and continuing until age 75 years. You may not need to do this test if you get a colonoscopy every 10 years.  Flexible sigmoidoscopy or colonoscopy.** / Every 5 years for a  flexible sigmoidoscopy or every 10 years for a colonoscopy beginning at age 50 years and continuing until age 75 years.  Hepatitis C blood test.** / For all people born from 1945 through 1965 and any individual with known risks for hepatitis C.  Osteoporosis screening.** / A one-time screening for women ages 65 years and over and women at risk for fractures or osteoporosis.  Skin self-exam. / Monthly.  Influenza vaccine. / Every year.  Tetanus, diphtheria, and acellular pertussis (Tdap/Td) vaccine.** / 1 dose of Td every 10 years.  Varicella vaccine.** / Consult your health care provider.  Zoster vaccine.** / 1 dose for adults aged 60 years or older.  Pneumococcal 13-valent conjugate (PCV13) vaccine.** / Consult your health care provider.  Pneumococcal polysaccharide (PPSV23) vaccine.** / 1 dose for all adults aged 65 years and older.  Meningococcal vaccine.** / Consult your health care provider.  Hepatitis A vaccine.** / Consult your health care provider.  Hepatitis B vaccine.** / Consult your health care   provider.  Haemophilus influenzae type b (Hib) vaccine.** / Consult your health care provider. ** Family history and personal history of risk and conditions may change your health care provider's recommendations.   This information is not intended to replace advice given to you by your health care provider. Make sure you discuss any questions you have with your health care provider.   Document Released: 08/20/2001 Document Revised: 07/15/2014 Document Reviewed: 11/19/2010 Elsevier Interactive Patient Education [employer].

## 2016-03-15 NOTE — Assessment & Plan Note (Signed)
+   family history. No personal history of abnormal mammogram. Order for screening mammogram placed.

## 2016-03-15 NOTE — Progress Notes (Signed)
Patient presents to clinic today for annual exam.  Patient is fasting for labs.  Acute Concerns: Patient endorses some episodes of lightheadedness with standing. Denies dizziness, chest pain or palpitations. Denies SOB.  Is not staying hydrated and is only eating 1-2 x day. Denies history of anemia. Recent TSH within normal limits.  Chronic Issues: Acute Stress Reaction with depressed mood -- Currently on Citalopram 20 mg daily. Is taking as directed. Mood had improved overall. Still having some labile mood after going back to work. Endorses panic attack yesterday, taking a Valium to attempt to help with symptoms. Denies specific trigger yesterday that set of her panic attack. Is still in a financial struggle with estranged husband. Denies SI/HI. Is continuing counseling with our Financial controller counselors Almyra [facility] Braddock). Next appointment is October 5th.    Health Maintenance: Immunizations -- Flu shot given by CMA today. Unsure of last Tetanus shot. Feels greater than 10 years.  Mammogram -- Endorses mammogram yearly since 25 due to significant family history or breast cancer. Last mammogram 2 years. Denies history of abnormal mammogram but does note history of fibrocystic breast disease. PAP -- Unsure of last PAP smear. + history abnormal PAP smear (last 12 years ago). + hx LEEP procedure 14 years prior.  HIV Screening -- Has had with birth of youngest son, 75 years prior. No concerns about needing rechecked today.  Past Medical History:  Diagnosis Date  . Anxiety   . Depression   . History of chicken pox   . Panic attacks   . PTSD (post-traumatic stress disorder)     Past Surgical History:  Procedure Laterality Date  . NO PAST SURGERIES      No current outpatient prescriptions on file prior to visit.   No current facility-administered medications on file prior to visit.     Allergies  Allergen Reactions  . Nsaids     Cause GI Issues in large amounts  . Penicillins Rash     Family History  Problem Relation Age of Onset  . Depression Mother     Living  . Diabetes Mother   . Hyperlipidemia Mother   . Hypertension Mother   . Heart attack Father     Living  . Heart disease Father   . Hyperlipidemia Father   . Hypertension Father   . Thyroid disease Sister   . Diabetes Maternal Grandmother   . Alcoholism Maternal Grandfather   . Breast cancer Paternal Grandmother     PGGM-Breast Cancer  . Breast cancer Paternal Aunt   . Heart attack Maternal Uncle   . Stroke Maternal Uncle   . Heart disease Maternal Uncle   . Ulcerative colitis Son     Age 33  . Depression Son     Age 12  . Healthy Neg Hx     Age 72    Social History   Social History  . Marital status: Divorced    Spouse name: N/A  . Number of children: N/A  . Years of education: N/A   Occupational History  . Not on file.   Social History Main Topics  . Smoking status: Current Every Day Smoker    Packs/day: 0.50    Years: 30.00    Types: Cigarettes  . Smokeless tobacco: Never Used  . Alcohol use Yes     Comment: social drinker -- 1-2 beers if out with friends  . Drug use: No  . Sexual activity: Not Currently   Other Topics Concern  .  Not on file   Social History Narrative  . No narrative on file   Review of Systems  Constitutional: Negative for fever and weight loss.  HENT: Negative for ear discharge, ear pain, hearing loss and tinnitus.   Eyes: Negative for blurred vision, double vision, photophobia and pain.  Respiratory: Negative for cough and shortness of breath.   Cardiovascular: Negative for chest pain and palpitations.  Gastrointestinal: Negative for abdominal pain, blood in stool, constipation, diarrhea, heartburn, melena, nausea and vomiting.  Genitourinary: Negative for dysuria, flank pain, frequency, hematuria and urgency.  Musculoskeletal: Negative for falls.  Neurological: Negative for dizziness, loss of consciousness and headaches.  Endo/Heme/Allergies:  Negative for environmental allergies.  Psychiatric/Behavioral: Negative for depression, hallucinations, substance abuse and suicidal ideas. The patient is not nervous/anxious and does not have insomnia.    BP (!) 92/56 (BP Location: Left Arm, Patient Position: Sitting, Cuff Size: Normal)   Pulse 67   Temp 98.3 F (36.8 C) (Oral)   Resp 16   Ht 5\' 4"  (1.626 m)   Wt 164 lb 2 oz (74.4 kg)   SpO2 98%   BMI 28.17 kg/m   Physical Exam  Constitutional: She is oriented to person, place, and time and well-developed, well-nourished, and in no distress.  HENT:  Head: Normocephalic and atraumatic.  Eyes: Conjunctivae are normal.  Cardiovascular: Normal rate, regular rhythm, normal heart sounds and intact distal pulses.   Pulmonary/Chest: Effort normal and breath sounds normal. No respiratory distress. She has no wheezes. She has no rales. She exhibits no tenderness.  Neurological: She is alert and oriented to person, place, and time.  Skin: Skin is warm and dry. No rash noted.  Psychiatric: Affect normal.  Vitals reviewed.  Recent Results (from the past 2160 hour(s))  TSH     Status: None   Collection Time: 02/20/16 10:02 AM  Result Value Ref Range   TSH 1.10 0.35 - 4.50 uIU/mL   Assessment/Plan: Visit for preventive health examination Depression screen negative. Health Maintenance reviewed -- Flu shot updated. She defers TD today. Referral to GYN for routine pelvic exam and pap smear.  Preventive schedule discussed and handout given in AVS. Will obtain fasting labs today.   Encounter for routine gynecological examination Referral to GYN placed for routine care.  Breast cancer screening + family history. No personal history of abnormal mammogram. Order for screening mammogram placed.  Acute stress reaction Will increase Citalopram to 40 mg daily. Will d/c Valium and start Klonopin for acute anxiety. FU 3 months. Will work on intermittent FMLA.  Episodic lightheadedness BP lower  today. No current symptoms. OVS negative. Will check lab panel today to assess. She is not eating or hydrating sufficiently. Dietary and hydration recommendations reviewed.     Leeanne Rio, PA-C

## 2016-03-15 NOTE — Assessment & Plan Note (Signed)
BP lower today. No current symptoms. OVS negative. Will check lab panel today to assess. She is not eating or hydrating sufficiently. Dietary and hydration recommendations reviewed.

## 2016-03-27 ENCOUNTER — Ambulatory Visit (INDEPENDENT_AMBULATORY_CARE_PROVIDER_SITE_OTHER): Payer: 59 | Admitting: Family Medicine

## 2016-03-27 ENCOUNTER — Encounter: Payer: Self-pay | Admitting: Family Medicine

## 2016-03-27 VITALS — BP 102/62 | HR 68 | Ht 64.0 in | Wt 156.0 lb

## 2016-03-27 DIAGNOSIS — Z124 Encounter for screening for malignant neoplasm of cervix: Secondary | ICD-10-CM

## 2016-03-27 DIAGNOSIS — Z01419 Encounter for gynecological examination (general) (routine) without abnormal findings: Secondary | ICD-10-CM | POA: Diagnosis not present

## 2016-03-27 DIAGNOSIS — Z113 Encounter for screening for infections with a predominantly sexual mode of transmission: Secondary | ICD-10-CM | POA: Diagnosis not present

## 2016-03-27 DIAGNOSIS — Z1239 Encounter for other screening for malignant neoplasm of breast: Secondary | ICD-10-CM

## 2016-03-27 LAB — HIV ANTIBODY (ROUTINE TESTING W REFLEX): HIV: NONREACTIVE

## 2016-03-27 NOTE — Progress Notes (Signed)
Subjective:    Hannah Keith is a 45 y.o. female who presents for an annual exam. The patient has no complaints today. The patient is not currently sexually active. GYN screening history: last mammogram: approximate date 2015 and was normal and pt not sure of last PAP.  Does have history of abnormal PAP and had LEEP about 13 years ago.. The patient participates in regular exercise: no.   Just recently separated from husband.  Concerned about STD.  Strong family history of breast cancer: PGGM, PGM, PGreat Aunt x 2, Aunt.  Menstrual History: OB History    Gravida Para Term Preterm AB Living   '2 2 2         '$ SAB TAB Ectopic Multiple Live Births           2      No LMP recorded. Patient is postmenopausal.    The following portions of the patient's history were reviewed and updated as appropriate: allergies, current medications, past family history, past medical history, past social history, past surgical history and problem list.  Review of Systems Pertinent items noted in HPI and remainder of comprehensive ROS otherwise negative.    Objective:      General Appearance:    Alert, cooperative, no distress, appears stated age  Head:    Normocephalic, without obvious abnormality, atraumatic  Eyes:    PERRL, conjunctiva/corneas clear, EOM's intact, fundi    benign, both eyes  Ears:    Normal TM's and external ear canals, both ears  Nose:   Nares normal, septum midline, mucosa normal, no drainage    or sinus tenderness  Throat:   Lips, mucosa, and tongue normal; teeth and gums normal  Neck:   Supple, symmetrical, trachea midline, no adenopathy;    thyroid:  no enlargement/tenderness/nodules; no carotid   bruit or JVD  Back:     Symmetric, no curvature, ROM normal, no CVA tenderness  Lungs:     Clear to auscultation bilaterally, respirations unlabored  Chest Wall:    No tenderness or deformity   Heart:    Regular rate and rhythm, S1 and S2 normal, no murmur, rub   or gallop  Breast Exam:     No tenderness, masses, or nipple abnormality  Abdomen:     Soft, non-tender, bowel sounds active all four quadrants,    no masses, no organomegaly  Genitalia:    Normal female without lesion, discharge or tenderness.         Vaginal rugae normal.  Cervix normal in appearance.  Uterus   normal in size.  Adnexa normal, no masses or fullness   palpated.  Extremities:   Extremities normal, atraumatic, no cyanosis or edema  Pulses:   2+ and symmetric all extremities  Skin:   Skin color, texture, turgor normal, no rashes or lesions  Lymph nodes:   Cervical, supraclavicular, and axillary nodes normal  Neurologic:   CNII-XII intact, normal strength, sensation and reflexes    throughout    .    Assessment:    Healthy female exam.    Plan:     All questions answered. Blood tests: HIV, HepC. Discussed healthy lifestyle modifications. Follow up in 1 year. Mammogram. Thin prep Pap smear. GC/CT.   Will have pt fill out survey to get approval for BRCA testing.

## 2016-03-28 LAB — RPR

## 2016-03-28 LAB — HEPATITIS B SURFACE ANTIGEN: HEP B S AG: NEGATIVE

## 2016-03-29 ENCOUNTER — Telehealth: Payer: Self-pay

## 2016-03-29 LAB — CYTOLOGY - PAP

## 2016-03-29 NOTE — Telephone Encounter (Signed)
Patient given financial information about the My risk testing for BRCA. Patient also given financial assistance phone number to where she can call and discuss her expected payment. Patient states she will wait until next year to do the testing. Kathrene Alu RN BSN

## 2016-04-02 ENCOUNTER — Ambulatory Visit (HOSPITAL_BASED_OUTPATIENT_CLINIC_OR_DEPARTMENT_OTHER)
Admission: RE | Admit: 2016-04-02 | Discharge: 2016-04-02 | Disposition: A | Discharge status: Home / Self Care | Payer: 59 | Source: Ambulatory Visit | Attending: Family Medicine | Admitting: Family Medicine

## 2016-04-02 ENCOUNTER — Telehealth: Payer: Self-pay

## 2016-04-02 DIAGNOSIS — Z1231 Encounter for screening mammogram for malignant neoplasm of breast: Secondary | ICD-10-CM | POA: Diagnosis not present

## 2016-04-02 DIAGNOSIS — Z01419 Encounter for gynecological examination (general) (routine) without abnormal findings: Secondary | ICD-10-CM

## 2016-04-02 NOTE — Telephone Encounter (Signed)
-----   Message from Truett Mainland, DO sent at 04/01/2016  2:21 PM EDT ----- STD screen and PAP were normal.  Please let pt know.

## 2016-04-02 NOTE — Telephone Encounter (Signed)
Patient called and made aware that pap and STD screening are within normal limits. Patient states understanding. Kathrene Alu RNBSN

## 2016-04-11 ENCOUNTER — Ambulatory Visit (INDEPENDENT_AMBULATORY_CARE_PROVIDER_SITE_OTHER): Payer: 59 | Admitting: Licensed Clinical Social Worker

## 2016-04-11 DIAGNOSIS — F331 Major depressive disorder, recurrent, moderate: Secondary | ICD-10-CM

## 2016-05-01 ENCOUNTER — Encounter: Payer: Self-pay | Admitting: Physician Assistant

## 2016-05-01 ENCOUNTER — Ambulatory Visit (INDEPENDENT_AMBULATORY_CARE_PROVIDER_SITE_OTHER): Payer: 59 | Admitting: Physician Assistant

## 2016-05-01 VITALS — BP 102/68 | HR 75 | Temp 98.5°F | Resp 16 | Ht 64.0 in | Wt 158.0 lb

## 2016-05-01 DIAGNOSIS — J069 Acute upper respiratory infection, unspecified: Secondary | ICD-10-CM | POA: Diagnosis not present

## 2016-05-01 DIAGNOSIS — B9789 Other viral agents as the cause of diseases classified elsewhere: Secondary | ICD-10-CM

## 2016-05-01 NOTE — Patient Instructions (Signed)
Your flu swab is negative. Symptoms are consistent with a viral infection. Stay well hydrated and get plenty of rest. Place a humidifier in the bedroom. Get some Mucinex-DM to help with congestion and cough. Saline nasal spray will also be beneficial.  Follow-up if symptoms are not resolving.

## 2016-05-01 NOTE — Progress Notes (Signed)
Patient presents to clinic today c/o 2 days of nasal congestion, runny nose, cough and fatigue. Is unsure of fever. Notes some mild aches. Denies chest pain or SOB. Denies recent travel or sick contact.   Past Medical History:  Diagnosis Date  . Anxiety   . Depression   . History of chicken pox   . Panic attacks   . PTSD (post-traumatic stress disorder)   . Vaginal Pap smear, abnormal     Current Outpatient Prescriptions on File Prior to Visit  Medication Sig Dispense Refill  . citalopram (CELEXA) 40 MG tablet Take 1 tablet (40 mg total) by mouth daily. 30 tablet 3  . clonazePAM (KLONOPIN) 0.5 MG tablet Take 1 tablet (0.5 mg total) by mouth 2 (two) times daily as needed for anxiety. 20 tablet 1   No current facility-administered medications on file prior to visit.     Allergies  Allergen Reactions  . Nsaids     Cause GI Issues in large amounts  . Penicillins Rash    Family History  Problem Relation Age of Onset  . Depression Mother     Living  . Diabetes Mother   . Hyperlipidemia Mother   . Hypertension Mother   . Heart attack Father     Living  . Heart disease Father   . Hyperlipidemia Father   . Hypertension Father   . Thyroid disease Sister   . Diabetes Maternal Grandmother   . Alcoholism Maternal Grandfather   . Breast cancer Paternal Grandmother     PGGM-Breast Cancer  . Breast cancer Paternal Aunt   . Heart attack Maternal Uncle   . Stroke Maternal Uncle   . Heart disease Maternal Uncle   . Ulcerative colitis Son     Age 54  . Depression Son     Age 8  . Breast cancer Other   . Healthy Neg Hx     Age 11    Social History   Social History  . Marital status: Divorced    Spouse name: N/A  . Number of children: N/A  . Years of education: N/A   Social History Main Topics  . Smoking status: Current Every Day Smoker    Packs/day: 0.50    Years: 30.00    Types: Cigarettes  . Smokeless tobacco: Never Used  . Alcohol use Yes     Comment: social  drinker -- 1-2 beers if out with friends  . Drug use: No  . Sexual activity: Not Currently   Other Topics Concern  . None   Social History Narrative  . None    Review of Systems - See HPI.  All other ROS are negative.  BP 102/68 (BP Location: Left Arm, Patient Position: Sitting, Cuff Size: Large)   Pulse 75   Temp 98.5 F (36.9 C) (Oral)   Resp 16   Ht 5\' 4"  (1.626 m)   Wt 158 lb (71.7 kg)   SpO2 98%   BMI 27.12 kg/m   Physical Exam  Constitutional: She is oriented to person, place, and time and well-developed, well-nourished, and in no distress.  HENT:  Head: Normocephalic and atraumatic.  Right Ear: External ear normal.  Left Ear: External ear normal.  Nose: Nose normal.  Mouth/Throat: Oropharynx is clear and moist. No oropharyngeal exudate.  TM within normal limits bilaterally  Eyes: Conjunctivae are normal.  Neck: Neck supple.  Cardiovascular: Normal rate, regular rhythm, normal heart sounds and intact distal pulses.   Pulmonary/Chest: Effort normal and  breath sounds normal. No respiratory distress. She has no wheezes. She has no rales. She exhibits no tenderness.  Neurological: She is alert and oriented to person, place, and time.  Skin: Skin is warm and dry. No rash noted.  Psychiatric: Affect normal.  Vitals reviewed.   Recent Results (from the past 2160 hour(s))  TSH     Status: None   Collection Time: 02/20/16 10:02 AM  Result Value Ref Range   TSH 1.10 0.35 - 4.50 uIU/mL  CBC     Status: Abnormal   Collection Time: 03/15/16  9:45 AM  Result Value Ref Range   WBC 6.4 4.0 - 10.5 K/uL   RBC 4.69 3.87 - 5.11 Mil/uL   Platelets 220.0 150.0 - 400.0 K/uL   Hemoglobin 14.7 12.0 - 15.0 g/dL   HCT 43.7 36.0 - 46.0 %   MCV 93.2 78.0 - 100.0 fl   MCHC 33.6 30.0 - 36.0 g/dL   RDW 15.7 (H) 11.5 - 15.5 %  Comprehensive metabolic panel     Status: Abnormal   Collection Time: 03/15/16  9:45 AM  Result Value Ref Range   Sodium 141 135 - 145 mEq/L   Potassium 4.0  3.5 - 5.1 mEq/L   Chloride 108 96 - 112 mEq/L   CO2 29 19 - 32 mEq/L   Glucose, Bld 101 (H) 70 - 99 mg/dL   BUN 7 6 - 23 mg/dL   Creatinine, Ser 0.81 0.40 - 1.20 mg/dL   Total Bilirubin 0.5 0.2 - 1.2 mg/dL   Alkaline Phosphatase 62 39 - 117 U/L   AST 11 0 - 37 U/L   ALT 14 0 - 35 U/L   Total Protein 6.5 6.0 - 8.3 g/dL   Albumin 4.0 3.5 - 5.2 g/dL   Calcium 9.1 8.4 - 10.5 mg/dL   GFR 80.45 >60.00 mL/min  Lipid panel     Status: Abnormal   Collection Time: 03/15/16  9:45 AM  Result Value Ref Range   Cholesterol 165 0 - 200 mg/dL    Comment: ATP III Classification       Desirable:  < 200 mg/dL               Borderline High:  200 - 239 mg/dL          High:  > = 240 mg/dL   Triglycerides 76.0 0.0 - 149.0 mg/dL    Comment: Normal:  <150 mg/dLBorderline High:  150 - 199 mg/dL   HDL 40.00 >39.00 mg/dL   VLDL 15.2 0.0 - 40.0 mg/dL   LDL Cholesterol 109 (H) 0 - 99 mg/dL   Total CHOL/HDL Ratio 4     Comment:                Men          Women1/2 Average Risk     3.4          3.3Average Risk          5.0          4.42X Average Risk          9.6          7.13X Average Risk          15.0          11.0                       NonHDL 124.69     Comment: NOTE:  Non-HDL goal  should be 30 mg/dL higher than patient's LDL goal (i.e. LDL goal of < 70 mg/dL, would have non-HDL goal of < 100 mg/dL)  Hemoglobin A1c     Status: None   Collection Time: 03/15/16  9:45 AM  Result Value Ref Range   Hgb A1c MFr Bld 5.7 4.6 - 6.5 %    Comment: Glycemic Control Guidelines for People with Diabetes:Non Diabetic:  <6%Goal of Therapy: <7%Additional Action Suggested:  >8%   Urinalysis, Routine w reflex microscopic (not at [facility])     Status: Abnormal   Collection Time: 03/15/16  9:45 AM  Result Value Ref Range   Color, Urine YELLOW Yellow;Lt. Yellow   APPearance CLEAR Clear   Specific Gravity, Urine <=1.005 (A) 1.000 - 1.030   pH 6.5 5.0 - 8.0   Total Protein, Urine NEGATIVE Negative   Urine Glucose NEGATIVE Negative     Ketones, ur NEGATIVE Negative   Bilirubin Urine NEGATIVE Negative   Hgb urine dipstick NEGATIVE Negative   Urobilinogen, UA 0.2 0.0 - 1.0   Leukocytes, UA NEGATIVE Negative   Nitrite NEGATIVE Negative   WBC, UA none seen 0-2/hpf   RBC / HPF none seen 0-2/hpf   Squamous Epithelial / LPF Rare(0-4/hpf) Rare(0-4/hpf)  Cytology - PAP     Status: None   Collection Time: 03/27/16 12:00 AM  Result Value Ref Range   CYTOLOGY - PAP PAP RESULT   HIV antibody (with reflex)     Status: None   Collection Time: 03/27/16  4:04 PM  Result Value Ref Range   HIV 1&2 Ab, 4th Generation NONREACTIVE NONREACTIVE    Comment:   HIV-1 antigen and HIV-1/HIV-2 antibodies were not detected.  There is no laboratory evidence of HIV infection.   HIV-1/2 Antibody Diff        Not indicated. HIV-1 RNA, Qual TMA          Not indicated.     PLEASE NOTE: This information has been disclosed to you from records whose confidentiality may be protected by state law. If your state requires such protection, then the state law prohibits you from making any further disclosure of the information without the specific written consent of the person to whom it pertains, or as otherwise permitted by law. A general authorization for the release of medical or other information is NOT sufficient for this purpose.   The performance of this assay has not been clinically validated in patients less than 47 years old.   For additional information please refer to http://education.[facility].com/faq/FAQ106.  (This link is being provided for informational/educational purposes only.)     Hepatitis B Surface AntiGEN     Status: None   Collection Time: 03/27/16  4:04 PM  Result Value Ref Range   Hepatitis B Surface Ag NEGATIVE NEGATIVE  RPR     Status: None   Collection Time: 03/27/16  4:04 PM  Result Value Ref Range   RPR Ser Ql NON REAC NON REAC    Assessment/Plan: 1. Viral URI Flu swab negative. Symptoms and  examination consistent with viral URI. Supportive measures and OTC medications reviewed with patient. Rest and hydration key. FU if symptoms are not resolving.   Hannah Rio, PA-C

## 2016-05-01 NOTE — Progress Notes (Signed)
Pre visit review using our clinic review tool, if applicable. No additional management support is needed unless otherwise documented below in the visit note/SLS  

## 2016-05-09 ENCOUNTER — Ambulatory Visit (INDEPENDENT_AMBULATORY_CARE_PROVIDER_SITE_OTHER): Payer: 59 | Admitting: Licensed Clinical Social Worker

## 2016-05-09 DIAGNOSIS — F331 Major depressive disorder, recurrent, moderate: Secondary | ICD-10-CM

## 2016-06-14 ENCOUNTER — Ambulatory Visit: Payer: Self-pay | Admitting: Physician Assistant

## 2016-06-30 ENCOUNTER — Other Ambulatory Visit: Payer: Self-pay | Admitting: Physician Assistant

## 2016-06-30 DIAGNOSIS — F430 Acute stress reaction: Secondary | ICD-10-CM

## 2016-07-02 NOTE — Telephone Encounter (Signed)
Medication filled to pharmacy as requested.   

## 2016-07-02 NOTE — Telephone Encounter (Signed)
Last OV 05/01/16 (URI) Clonazepam last filled 03/15/16 #20 with 1

## 2016-11-09 DIAGNOSIS — M79672 Pain in left foot: Secondary | ICD-10-CM | POA: Diagnosis not present

## 2016-11-12 DIAGNOSIS — R739 Hyperglycemia, unspecified: Secondary | ICD-10-CM | POA: Diagnosis not present

## 2016-11-12 DIAGNOSIS — M79672 Pain in left foot: Secondary | ICD-10-CM | POA: Diagnosis not present

## 2017-01-30 ENCOUNTER — Other Ambulatory Visit: Payer: Self-pay | Admitting: Family Medicine

## 2017-01-30 ENCOUNTER — Other Ambulatory Visit: Payer: Self-pay | Admitting: Physician Assistant

## 2017-01-30 DIAGNOSIS — Z1231 Encounter for screening mammogram for malignant neoplasm of breast: Secondary | ICD-10-CM

## 2017-04-08 DIAGNOSIS — Z23 Encounter for immunization: Secondary | ICD-10-CM | POA: Diagnosis not present

## 2017-05-30 DIAGNOSIS — R350 Frequency of micturition: Secondary | ICD-10-CM | POA: Diagnosis not present

## 2017-05-30 DIAGNOSIS — A499 Bacterial infection, unspecified: Secondary | ICD-10-CM | POA: Diagnosis not present

## 2017-05-30 DIAGNOSIS — N390 Urinary tract infection, site not specified: Secondary | ICD-10-CM | POA: Diagnosis not present

## 2017-09-26 DIAGNOSIS — L0201 Cutaneous abscess of face: Secondary | ICD-10-CM | POA: Diagnosis not present

## 2017-10-27 ENCOUNTER — Telehealth: Payer: Self-pay | Admitting: Physician Assistant

## 2017-10-27 NOTE — Telephone Encounter (Signed)
OK 

## 2017-10-27 NOTE — Telephone Encounter (Signed)
Scheduled appt 12/04/17 @ 7:45am

## 2017-10-27 NOTE — Telephone Encounter (Signed)
Ok with me 

## 2017-10-27 NOTE — Telephone Encounter (Signed)
Copied from [place]. Topic: Quick Communication - See Telephone Encounter >> Oct 27, 2017  4:44 PM Genella Rife H wrote: CRM for notification. See Telephone encounter for: 10/27/17.  Pt wants to transfer care from [facility] Noble to Dr. Nani Ravens. Please advise.

## 2017-11-03 ENCOUNTER — Encounter: Payer: Self-pay | Admitting: Family Medicine

## 2017-11-03 ENCOUNTER — Ambulatory Visit (INDEPENDENT_AMBULATORY_CARE_PROVIDER_SITE_OTHER): Payer: 59 | Admitting: Family Medicine

## 2017-11-03 VITALS — BP 120/70 | HR 75 | Temp 98.0°F | Ht 64.0 in | Wt 177.5 lb

## 2017-11-03 DIAGNOSIS — L255 Unspecified contact dermatitis due to plants, except food: Secondary | ICD-10-CM

## 2017-11-03 MED ORDER — PREDNISONE 20 MG PO TABS: ORAL_TABLET | ORAL | 0 refills | Status: DC

## 2017-11-03 NOTE — Patient Instructions (Addendum)
Try not to itch.  Things to look out for: increasing pain not relieved by ibuprofen/acetaminophen, fevers, spreading redness, drainage of pus, or foul odor.  Let us know if you need anything.  Poison Ivy Dermatitis Poison ivy dermatitis is redness and soreness (inflammation) of the skin. It is caused by a chemical that is found on the leaves of the poison ivy plant. You may also have itching, a rash, and blisters. Symptoms often clear up in 1-2 weeks. You may get this condition by touching a poison ivy plant. You can also get it by touching something that has the chemical on it. This may include animals or objects that have come in contact with the plant. Follow these instructions at home: General instructions  Take or apply over-the-counter and prescription medicines only as told by your doctor.  If you touch poison ivy, wash your skin with soap and cold water right away.  Use hydrocortisone creams or calamine lotion as needed to help with itching.  Take oatmeal baths as needed. Use colloidal oatmeal. You can get this at a pharmacy or grocery store. Follow the instructions on the package.  Do not scratch or rub your skin.  While you have the rash, wash your clothes right after you wear them. Prevention  Know what poison ivy looks like so you can avoid it. This plant has three leaves with flowering branches on a single stem. The leaves are glossy. They have uneven edges that come to a point at the front.  If you have touched poison ivy, wash with soap and water right away. Be sure to wash under your fingernails.  When hiking or camping, wear long pants, a long-sleeved shirt, tall socks, and hiking boots. You can also use a lotion on your skin that helps to prevent contact with the chemical on the plant.  If you think that your clothes or outdoor gear came in contact with poison ivy, rinse them off with a garden hose before you bring them inside your house. Contact a doctor if:  You  have open sores in the rash area.  You have more redness, swelling, or pain in the affected area.  You have redness that spreads beyond the rash area.  You have fluid, blood, or pus coming from the affected area.  You have a fever.  You have a rash over a large area of your body.  You have a rash on your eyes, mouth, or genitals.  Your rash does not get better after a few days. Get help right away if:  Your face swells or your eyes swell shut.  You have trouble breathing.  You have trouble swallowing. This information is not intended to replace advice given to you by your health care provider. Make sure you discuss any questions you have with your health care provider. Document Released: 07/27/2010 Document Revised: 11/30/2015 Document Reviewed: 11/30/2014 Elsevier Interactive Patient Education  [employer].

## 2017-11-03 NOTE — Progress Notes (Signed)
Chief Complaint  Patient presents with  . Poison Ivy    Hannah Keith is a 47 y.o. female here for a skin complaint.  Duration: 6 days Location: arms Pruritic? Yes Painful? No Drainage? Yes - clear fluid New soaps/lotions/topicals/detergents? No Was working outside, thinks may have been exposed to poison ivy Other associated symptoms: Blistering Therapies tried thus far: topical cortisone She has been scratching in b/t blisters to try avoiding breaking them open.   ROS:  Const: No fevers Skin: As noted in HPI  Past Medical History:  Diagnosis Date  . Anxiety   . Depression   . History of chicken pox   . Panic attacks   . PTSD (post-traumatic stress disorder)   . Vaginal Pap smear, abnormal    Allergies  Allergen Reactions  . Nsaids     Cause GI Issues in large amounts  . Penicillins Rash     BP 120/70 (BP Location: Left Arm, Patient Position: Sitting, Cuff Size: Normal)   Pulse 75   Temp 98 F (36.7 C) (Oral)   Ht 5\' 4"  (1.626 m)   Wt 177 lb 8 oz (80.5 kg)   SpO2 93%   BMI 30.47 kg/m  Gen: awake, alert, appearing stated age Lungs: No accessory muscle use Skin: See below. No drainage, TTP Psych: Age appropriate judgment and insight    R dorsal forearm, distal    LUE    RUE  Rhus dermatitis - Plan: predniSONE (DELTASONE) 20 MG tablet  3 week pred taper 60 mg, 40 mg, 20 mg. Try not to scratch. Warning signs and symptoms verbalized and written down in AVS.  F/u prn. The patient voiced understanding and agreement to the plan.  [place], DO 11/03/17 10:47 AM

## 2017-12-04 ENCOUNTER — Encounter: Payer: Self-pay | Admitting: Family Medicine

## 2017-12-04 ENCOUNTER — Ambulatory Visit (INDEPENDENT_AMBULATORY_CARE_PROVIDER_SITE_OTHER): Payer: 59 | Admitting: Family Medicine

## 2017-12-04 VITALS — BP 108/78 | HR 87 | Temp 98.3°F | Ht 64.0 in | Wt 178.2 lb

## 2017-12-04 DIAGNOSIS — Z1231 Encounter for screening mammogram for malignant neoplasm of breast: Secondary | ICD-10-CM

## 2017-12-04 DIAGNOSIS — Z0000 Encounter for general adult medical examination without abnormal findings: Secondary | ICD-10-CM

## 2017-12-04 DIAGNOSIS — Z23 Encounter for immunization: Secondary | ICD-10-CM

## 2017-12-04 DIAGNOSIS — Z1239 Encounter for other screening for malignant neoplasm of breast: Secondary | ICD-10-CM

## 2017-12-04 DIAGNOSIS — R61 Generalized hyperhidrosis: Secondary | ICD-10-CM

## 2017-12-04 LAB — LIPID PANEL
CHOLESTEROL: 187 mg/dL (ref 0–200)
HDL: 52 mg/dL (ref >39.00)
LDL CALC: 111 mg/dL — AB (ref 0–99)
NonHDL: 135.12
TRIGLYCERIDES: 119 mg/dL (ref 0.0–149.0)
Total CHOL/HDL Ratio: 4
VLDL: 23.8 mg/dL (ref 0.0–40.0)

## 2017-12-04 LAB — TSH: TSH: 1.44 u[IU]/mL (ref 0.35–4.50)

## 2017-12-04 LAB — COMPREHENSIVE METABOLIC PANEL
ALT: 12 U/L (ref 0–35)
AST: 10 U/L (ref 0–37)
Albumin: 4 g/dL (ref 3.5–5.2)
Alkaline Phosphatase: 56 U/L (ref 39–117)
BILIRUBIN TOTAL: 0.4 mg/dL (ref 0.2–1.2)
BUN: 12 mg/dL (ref 6–23)
CO2: 27 meq/L (ref 19–32)
CREATININE: 0.91 mg/dL (ref 0.40–1.20)
Calcium: 9.4 mg/dL (ref 8.4–10.5)
Chloride: 106 mEq/L (ref 96–112)
GFR: 69.83 mL/min (ref >60.00)
Glucose, Bld: 101 mg/dL — ABNORMAL HIGH (ref 70–99)
Potassium: 4.2 mEq/L (ref 3.5–5.1)
Sodium: 139 mEq/L (ref 135–145)
Total Protein: 6.6 g/dL (ref 6.0–8.3)

## 2017-12-04 MED ORDER — ALUMINUM CHLORIDE 20 % EX SOLN: Freq: Every day | CUTANEOUS | 0 refills | Status: DC

## 2017-12-04 NOTE — Progress Notes (Signed)
Pre visit review using our clinic review tool, if applicable. No additional management support is needed unless otherwise documented below in the visit note. 

## 2017-12-04 NOTE — Progress Notes (Signed)
Chief Complaint  Patient presents with  . New Patient (Initial Visit)     Well Woman Favor Kreh is here for a complete physical.   Her last physical was >1 year ago.  Current diet: in general, "very unhealthy" Current exercise: Running, walking. Weight is stable and she denies daytime fatigue. No LMP recorded. Patient is postmenopausal. Seatbelt? Yes  Health Maintenance Pap/HPV- Yes Mammogram- Yes Tetanus- No HIV screening- Yes  Past Medical History:  Diagnosis Date  . Anxiety   . Depression   . History of chicken pox   . Panic attacks   . PTSD (post-traumatic stress disorder)   . Vaginal Pap smear, abnormal      Past Surgical History:  Procedure Laterality Date  . LEEP  2001-2002  . NO PAST SURGERIES      Medications  Current Outpatient Medications on File Prior to Visit  Medication Sig Dispense Refill  . clonazePAM (KLONOPIN) 0.5 MG tablet TAKE 1 TABLET BY MOUTH TWICE DAILY AS NEEDED FOR ANXIETY 20 tablet 0    Allergies Allergies  Allergen Reactions  . Nsaids     Cause GI Issues in large amounts  . Penicillins Rash    Review of Systems: Constitutional:  no unexpected weight changes Eye:  no eye pain Ear/Nose/Mouth/Throat:  Ears:  no tinnitus or vertigo and no recent change in hearing Nose/Mouth/Throat:  no complaints of nasal congestion, no sore throat Cardiovascular: no chest pain Respiratory:  no cough and no shortness of breath Gastrointestinal:  no abdominal pain, no change in bowel habits GU:  Female: negative for dysuria or pelvic pain Musculoskeletal/Extremities:  no pain of the joints Integumentary (Skin/Breast):  no abnormal skin lesions reported Neurologic:  no headaches Endocrine: +sweating Hematologic/Lymphatic:  No areas of easy bleeding  Exam BP 108/78 (BP Location: Left Arm, Patient Position: Sitting, Cuff Size: Normal)   Pulse 87   Temp 98.3 F (36.8 C) (Oral)   Ht 5\' 4"  (1.626 m)   Wt 178 lb 4 oz (80.9 kg)   SpO2 96%   BMI  30.60 kg/m  General:  well developed, well nourished, in no apparent distress Skin:  no significant moles, warts, or growths Head:  no masses, lesions, or tenderness Eyes:  pupils equal and round, sclera anicteric without injection Ears:  canals without lesions, TMs shiny without retraction, no obvious effusion, no erythema Nose:  nares patent, septum midline, mucosa normal, and no drainage or sinus tenderness Throat/Pharynx:  lips and gingiva without lesion; tongue and uvula midline; non-inflamed pharynx; no exudates or postnasal drainage Neck: neck supple without adenopathy, thyromegaly, or masses Lungs:  clear to auscultation, breath sounds equal bilaterally, no respiratory distress Cardio:  regular rate and rhythm, no bruits, no LE edema Abdomen:  abdomen soft, nontender; bowel sounds normal; no masses or organomegaly Genital: Defer to GYN Musculoskeletal:  symmetrical muscle groups noted without atrophy or deformity Extremities:  no clubbing, cyanosis, or edema, no deformities, no skin discoloration  Neuro:  gait normal; deep tendon reflexes normal and symmetric Psych: well oriented with normal range of affect and appropriate judgment/insight  Assessment and Plan  Well adult exam - Plan: Comprehensive metabolic panel, Lipid panel  Screening for breast cancer - Plan: MM DIGITAL SCREENING BILATERAL  Hyperhidrosis - Plan: TSH  Need for tetanus booster - Plan: Tdap vaccine greater than or equal to 7yo IM  Need for vaccination against Streptococcus pneumoniae - Plan: Pneumococcal polysaccharide vaccine 23-valent greater than or equal to 2yo subcutaneous/IM   Well 47 y.o.  female. Counseled on diet and exercise. Other orders as above. Stop smoking. Follow up in 6 weeks to recheck sweating- Qbrexxa vs derm referral depending on how she is doing. The patient voiced understanding and agreement to the plan.  [place], DO 12/04/17 8:40 AM

## 2017-12-04 NOTE — Patient Instructions (Addendum)
Stop smoking!  Aim to do some physical exertion for 150 minutes per week. This is typically divided into 5 days per week, 30 minutes per day. The activity should be enough to get your heart rate up. Anything is better than nothing if you have time constraints.  Healthy Eating Plan Many factors influence your heart health, including eating and exercise habits. Heart (coronary) risk increases with abnormal blood fat (lipid) levels. Heart-healthy meal planning includes limiting unhealthy fats, increasing healthy fats, and making other small dietary changes. This includes maintaining a healthy body weight to help keep lipid levels within a normal range.  WHAT IS MY PLAN?  Your health care provider recommends that you:  Drink a glass of water before meals to help with satiety.  Eat slowly.  An alternative to the water is to add Metamucil. This will help with satiety as well. It does contain calories, unlike water.  WHAT TYPES OF FAT SHOULD I CHOOSE?  Choose healthy fats more often. Choose monounsaturated and polyunsaturated fats, such as olive oil and canola oil, flaxseeds, walnuts, almonds, and seeds.  Eat more omega-3 fats. Good choices include salmon, mackerel, sardines, tuna, flaxseed oil, and ground flaxseeds. Aim to eat fish at least two times each week.  Avoid foods with partially hydrogenated oils in them. These contain trans fats. Examples of foods that contain trans fats are stick margarine, some tub margarines, cookies, crackers, and other baked goods. If you are going to avoid a fat, this is the one to avoid!  WHAT GENERAL GUIDELINES DO I NEED TO FOLLOW?  Check food labels carefully to identify foods with trans fats. Avoid these types of options when possible.  Fill one half of your plate with vegetables and green salads. Eat 4-5 servings of vegetables per day. A serving of vegetables equals 1 cup of raw leafy vegetables,  cup of raw or cooked cut-up vegetables, or  cup of  vegetable juice.  Fill one fourth of your plate with whole grains. Look for the word "whole" as the first word in the ingredient list.  Fill one fourth of your plate with lean protein foods.  Eat 4-5 servings of fruit per day. A serving of fruit equals one medium whole fruit,  cup of dried fruit,  cup of fresh, frozen, or canned fruit. Try to avoid fruits in cups/syrups as the sugar content can be high.  Eat more foods that contain soluble fiber. Examples of foods that contain this type of fiber are apples, broccoli, carrots, beans, peas, and barley. Aim to get 20-30 g of fiber per day.  Eat more home-cooked food and less restaurant, buffet, and fast food.  Limit or avoid alcohol.  Limit foods that are high in starch and sugar.  Avoid fried foods when able.  Cook foods by using methods other than frying. Baking, boiling, grilling, and broiling are all great options. Other fat-reducing suggestions include: ? Removing the skin from poultry. ? Removing all visible fats from meats. ? Skimming the fat off of stews, soups, and gravies before serving them. ? Steaming vegetables in water or broth.  Lose weight if you are overweight. Losing just 5-10% of your initial body weight can help your overall health and prevent diseases such as diabetes and heart disease.  Increase your consumption of nuts, legumes, and seeds to 4-5 servings per week. One serving of dried beans or legumes equals  cup after being cooked, one serving of nuts equals 1 ounces, and one serving of seeds equals  ounce or 1 tablespoon.  WHAT ARE GOOD FOODS CAN I EAT? Grains Grainy breads (try to find bread that is 3 g of fiber per slice or greater), oatmeal, light popcorn. Whole-grain cereals. Rice and pasta, including brown rice and those that are made with whole wheat. Edamame pasta is a great alternative to grain pasta. It has a higher protein content. Try to avoid significant consumption of white bread, sugary cereals,  or pastries/baked goods.  Vegetables All vegetables. Cooked white potatoes do not count as vegetables.  Fruits All fruits, but limit pineapple and bananas as these fruits have a higher sugar content.  Meats and Other Protein Sources Lean, well-trimmed beef, veal, pork, and lamb. Chicken and [place] without skin. All fish and shellfish. Wild duck, rabbit, pheasant, and venison. Egg whites or low-cholesterol egg substitutes. Dried beans, peas, lentils, and tofu.Seeds and most nuts.  Dairy Low-fat or nonfat cheeses, including ricotta, string, and mozzarella. Skim or 1% milk that is liquid, powdered, or evaporated. Buttermilk that is made with low-fat milk. Nonfat or low-fat yogurt. Soy/Almond milk are good alternatives if you cannot handle dairy.  Beverages Water is the best for you. Sports drinks with less sugar are more desirable unless you are a highly active athlete.  Sweets and Desserts Sherbets and fruit ices. Honey, jam, marmalade, jelly, and syrups. Dark chocolate.  Eat all sweets and desserts in moderation.  Fats and Oils Nonhydrogenated (trans-free) margarines. Vegetable oils, including soybean, sesame, sunflower, olive, peanut, safflower, corn, canola, and cottonseed. Salad dressings or mayonnaise that are made with a vegetable oil. Limit added fats and oils that you use for cooking, baking, salads, and as spreads.  Other Cocoa powder. Coffee and tea. Most condiments.  The items listed above may not be a complete list of recommended foods or beverages. Contact your dietitian for more options.   Let us know if you need anything.

## 2018-01-15 ENCOUNTER — Telehealth: Payer: Self-pay | Admitting: Family Medicine

## 2018-01-15 NOTE — Telephone Encounter (Signed)
Copied from [place] [phone] [phone]. Topic: Quick Communication - See Telephone Encounter >> Jan 15, 2018  4:45 PM Neva Seat wrote: Pt's medication aluminum chloride (DRYSOL) 20 % external solution is on back order at the [facility].  Pt is needing another medication that she can use. Please call pt back to discuss.

## 2018-01-15 NOTE — Telephone Encounter (Signed)
Duplicate encounter

## 2018-01-15 NOTE — Telephone Encounter (Unsigned)
Copied from [place] [phone]. Topic: Quick Communication - See Telephone Encounter >> Jan 15, 2018  4:45 PM Neva Seat wrote: Pt's medication aluminum chloride (DRYSOL) 20 % external solution is on back order at the [facility].  Pt is needing another medication that she can use. Please call pt back to discuss.

## 2018-01-16 ENCOUNTER — Ambulatory Visit: Payer: 59 | Admitting: Family Medicine

## 2018-01-16 MED ORDER — GLYCOPYRRONIUM TOSYLATE 2.4 % EX PADS: 1.0000 "application " | MEDICATED_PAD | Freq: Every day | CUTANEOUS | 1 refills | Status: DC

## 2018-01-16 NOTE — Telephone Encounter (Signed)
Could try Qbrexza. See if pharmacy has payment assistance plz. TY.

## 2018-01-16 NOTE — Telephone Encounter (Signed)
Please advise 

## 2018-01-19 NOTE — Telephone Encounter (Signed)
Patient informed. 

## 2018-08-05 DIAGNOSIS — Z719 Counseling, unspecified: Secondary | ICD-10-CM | POA: Diagnosis not present

## 2018-09-01 DIAGNOSIS — Z719 Counseling, unspecified: Secondary | ICD-10-CM | POA: Diagnosis not present

## 2018-09-07 DIAGNOSIS — Z720 Tobacco use: Secondary | ICD-10-CM | POA: Diagnosis not present

## 2018-09-07 DIAGNOSIS — J069 Acute upper respiratory infection, unspecified: Secondary | ICD-10-CM | POA: Diagnosis not present

## 2018-09-07 DIAGNOSIS — R062 Wheezing: Secondary | ICD-10-CM | POA: Diagnosis not present

## 2018-09-08 DIAGNOSIS — Z719 Counseling, unspecified: Secondary | ICD-10-CM | POA: Diagnosis not present

## 2018-09-15 DIAGNOSIS — Z719 Counseling, unspecified: Secondary | ICD-10-CM | POA: Diagnosis not present

## 2018-09-29 DIAGNOSIS — Z719 Counseling, unspecified: Secondary | ICD-10-CM | POA: Diagnosis not present

## 2018-10-02 DIAGNOSIS — N390 Urinary tract infection, site not specified: Secondary | ICD-10-CM | POA: Diagnosis not present

## 2018-10-02 DIAGNOSIS — R300 Dysuria: Secondary | ICD-10-CM | POA: Diagnosis not present

## 2018-10-14 DIAGNOSIS — Z719 Counseling, unspecified: Secondary | ICD-10-CM | POA: Diagnosis not present

## 2018-10-22 DIAGNOSIS — Z719 Counseling, unspecified: Secondary | ICD-10-CM | POA: Diagnosis not present

## 2018-10-27 DIAGNOSIS — Z719 Counseling, unspecified: Secondary | ICD-10-CM | POA: Diagnosis not present

## 2019-03-09 ENCOUNTER — Ambulatory Visit (INDEPENDENT_AMBULATORY_CARE_PROVIDER_SITE_OTHER): Payer: 59 | Admitting: Family Medicine

## 2019-03-09 ENCOUNTER — Other Ambulatory Visit: Payer: Self-pay

## 2019-03-09 ENCOUNTER — Encounter: Payer: Self-pay | Admitting: Family Medicine

## 2019-03-09 VITALS — Temp 101.9°F

## 2019-03-09 DIAGNOSIS — Z20828 Contact with and (suspected) exposure to other viral communicable diseases: Secondary | ICD-10-CM

## 2019-03-09 DIAGNOSIS — J069 Acute upper respiratory infection, unspecified: Secondary | ICD-10-CM | POA: Diagnosis not present

## 2019-03-09 MED ORDER — PROMETHAZINE-DM 6.25-15 MG/5ML PO SYRP: 5.0000 mL | ORAL_SOLUTION | Freq: Four times a day (QID) | ORAL | 0 refills | Status: DC | PRN

## 2019-03-09 NOTE — Addendum Note (Signed)
Addended by: Sharon Seller B on: 03/09/2019 09:33 AM   Modules accepted: Orders

## 2019-03-09 NOTE — Progress Notes (Signed)
Chief Complaint  Patient presents with  . Fever    sick for 6 days  . Headache  . Cough    Hannah Keith here for URI complaints. Due to COVID-19 pandemic, we are interacting via web portal for an electronic face-to-face visit. I verified patient's ID using 2 identifiers. Patient agreed to proceed with visit via this method. Patient is at home, I am at office. Patient and I are present for visit.   Duration: 6 day  Associated symptoms: Fever (104.6 F max), sinus headache, myalgia and cough Denies: sinus congestion, sinus pain, rhinorrhea, itchy watery eyes, ear pain, ear drainage, sore throat, wheezing and shortness of breath Treatment to date: Dayquil, Nyquil, Tylenol, ibuprofen Sick contacts: No  ROS:  Const: +fevers HEENT: As noted in HPI Lungs: No SOB  Past Medical History:  Diagnosis Date  . Anxiety   . Depression   . History of chicken pox   . Panic attacks   . PTSD (post-traumatic stress disorder)   . Vaginal Pap smear, abnormal     Temp (!) 101.9 F (38.8 C) (Oral)  No conversational dyspnea Age appropriate judgment and insight Nml affect and mood  URI with cough and congestion - Plan: promethazine-dextromethorphan (PROMETHAZINE-DM) 6.25-15 MG/5ML syrup  Screen for COVID. Ibuprofen, Tylenol, continue to push fluids, practice good hand hygiene, cover mouth when coughing. Pt voiced understanding and agreement to the plan.  [place], DO 03/09/19 9:31 AM

## 2019-03-10 ENCOUNTER — Other Ambulatory Visit: Payer: Self-pay | Admitting: *Deleted

## 2019-03-10 DIAGNOSIS — Z20822 Contact with and (suspected) exposure to covid-19: Secondary | ICD-10-CM

## 2019-03-11 LAB — NOVEL CORONAVIRUS, NAA: SARS-CoV-2, NAA: NOT DETECTED

## 2019-04-20 ENCOUNTER — Other Ambulatory Visit: Payer: Self-pay

## 2019-04-21 ENCOUNTER — Ambulatory Visit (INDEPENDENT_AMBULATORY_CARE_PROVIDER_SITE_OTHER): Payer: 59 | Admitting: Family Medicine

## 2019-04-21 ENCOUNTER — Encounter: Payer: Self-pay | Admitting: Family Medicine

## 2019-04-21 VITALS — BP 110/62 | HR 95 | Temp 97.9°F | Ht 64.0 in | Wt 174.0 lb

## 2019-04-21 DIAGNOSIS — Z0001 Encounter for general adult medical examination with abnormal findings: Secondary | ICD-10-CM

## 2019-04-21 DIAGNOSIS — M79651 Pain in right thigh: Secondary | ICD-10-CM

## 2019-04-21 DIAGNOSIS — M7061 Trochanteric bursitis, right hip: Secondary | ICD-10-CM | POA: Diagnosis not present

## 2019-04-21 DIAGNOSIS — Z0000 Encounter for general adult medical examination without abnormal findings: Secondary | ICD-10-CM

## 2019-04-21 DIAGNOSIS — Z1231 Encounter for screening mammogram for malignant neoplasm of breast: Secondary | ICD-10-CM

## 2019-04-21 DIAGNOSIS — Z20828 Contact with and (suspected) exposure to other viral communicable diseases: Secondary | ICD-10-CM | POA: Diagnosis not present

## 2019-04-21 DIAGNOSIS — Z1211 Encounter for screening for malignant neoplasm of colon: Secondary | ICD-10-CM

## 2019-04-21 DIAGNOSIS — Z23 Encounter for immunization: Secondary | ICD-10-CM

## 2019-04-21 DIAGNOSIS — Z20822 Contact with and (suspected) exposure to covid-19: Secondary | ICD-10-CM

## 2019-04-21 NOTE — Patient Instructions (Addendum)
Give Hannah Keith 2-3 business days to get the results of your labs back.   Keep the diet clean and stay active.  If you do not hear anything about your referral in the next 1-2 weeks, call our office and ask for an update.  Call Dr Nehemiah Settle for your pelvic exam to screen for cervical cancer.   Consider Vick's rub on your toe or [place]. This can take 9-15 months to work. It will not harm you to avoid treatment though.  Heat (pad or rice pillow in microwave) over affected area, 10-15 minutes twice daily.   If the outer hip doesn't get better, come in for an injection.  If the shoe-tying muscle doesn't get better, let me know and we will get you set up with PT.   Let Hannah Keith know if you need anything.  Iliotibial Band Syndrome Rehab It is normal to feel mild stretching, pulling, tightness, or discomfort as you do these exercises, but you should stop right away if you feel sudden pain or your pain gets worse.  Stretching and range of motion exercises These exercises warm up your muscles and joints and improve the movement and flexibility of your hip and pelvis. Exercise A: Quadriceps, prone    1. Lie on your abdomen on a firm surface, such as a bed or padded floor. 2. Bend your left / right knee and hold your ankle. If you cannot reach your ankle or pant leg, loop a belt around your foot and grab the belt instead. 3. Gently pull your heel toward your buttocks. Your knee should not slide out to the side. You should feel a stretch in the front of your thigh and knee. 4. Hold this position for 30 seconds. Repeat 2 times. Complete this stretch 3 times per week. Exercise B: Iliotibial band    1. Lie on your side with your left / right leg in the top position. 2. Bend both of your knees and grab your left / right ankle. Stretch out your bottom arm to help you balance. 3. Slowly bring your top knee back so your thigh goes behind your trunk. 4. Slowly lower your top leg toward the floor until you feel a  gentle stretch on the outside of your left / right hip and thigh. If you do not feel a stretch and your knee will not fall farther, place the heel of your other foot on top of your knee and pull your knee down toward the floor with your foot. 5. Hold this position for 30 seconds. Repeat 2 times. Complete this stretch 3 times per week. Strengthening exercises These exercises build strength and endurance in your hip and pelvis. Endurance is the ability to use your muscles for a long time, even after they get tired. Exercise C: Straight leg raises (hip abductors)     1. Lie on your side with your left / right leg in the top position. Lie so your head, shoulder, knee, and hip line up. You may bend your bottom knee to help you balance. 2. Roll your hips slightly forward so your hips are stacked directly over each other and your left / right knee is facing forward. 3. Tense the muscles in your outer thigh and lift your top leg 4-6 inches (10-15 cm). 4. Hold this position for 3 seconds. Repeat for a total of 10 reps. 5. Slowly return to the starting position. Let your muscles relax completely before doing another repetition. Repeat 2 times. Complete this exercise 3 times  per week. Exercise D: Straight leg raises (hip extensors) 1. Lie on your abdomen on your bed or a firm surface. You can put a pillow under your hips if that is more comfortable. 2. Bend your left / right knee so your foot is straight up in the air. 3. Squeeze your buttock muscles and lift your left / right thigh off the bed. Do not let your back arch. 4. Tense this muscle as hard as you can without increasing any knee pain. 5. Hold this position for 2 seconds. Repeat for a total of 10 reps 6. Slowly lower your leg to the starting position and allow it to relax completely. Repeat 2 times. Complete this exercise 3 times per week. Exercise E: Hip hike 1. Stand sideways on a bottom step. Stand on your left / right leg with your other  foot unsupported next to the step. You can hold onto the railing or wall if needed for balance. 2. Keep your knees straight and your torso square. Then, lift your left / right hip up toward the ceiling. 3. Slowly let your left / right hip lower toward the floor, past the starting position. Your foot should get closer to the floor. Do not lean or bend your knees. Repeat 2 times. Complete this exercise 3 times per week.  Document Released: 06/24/2005 Document Revised: 02/27/2016 Document Reviewed: 05/26/2015 Elsevier Interactive Patient Education  2018 [employer].   Quadriceps Strain Rehab It is normal to feel mild stretching, pulling, tightness, or discomfort as you do these exercises, but you should stop right away if you feel sudden pain or your pain gets worse. Stretching and range of motion exercises These exercises warm up your muscles and joints and improve the movement and flexibility of your thigh. These exercises can also help to relieve stiffness or swelling. Exercise A: Heel slides   1. Lie on your back with both knees straight. If this causes back discomfort, bend the knee of your healthy leg, placing your foot flat on the floor. 2. Slowly slide your left / right heel back toward your buttocks until you feel a gentle stretch in the front of your knee or thigh. 3. Hold for 30 seconds. Then slowly slide your heel back to the starting position. Repeat 2 times. Complete this exercise 3 times a week. Exercise B: Quadriceps stretch, prone   1. Lie on your abdomen on a firm surface, such as a bed or padded floor. 2. Bend your left / right knee and hold your ankle. If you cannot reach your ankle or pant leg, loop a belt around your foot and grab the belt instead. 3. Gently pull your heel toward your buttocks. Your knee should not slide out to the side. You should feel a stretch in the front of your thigh and knee. 4. Hold this position for 30 seconds. Repeat 2 times. Complete this  exercise 3 times a week. Strengthening exercises These exercises build strength and endurance in your thigh. Endurance is the ability to use your muscles for a long time, even after your muscles get tired. Exercise C: Straight leg raises (quadriceps and hip flexors) Quality counts! Watch for signs that the quadriceps muscle is working to ensure that you are strengthening the correct muscles and not cheating by using healthier muscles. 1. Lie on your back with your left / right leg extended and your other knee bent. 2. Tense the muscles in the front of your left / right thigh. You should see your kneecap  slide up or see increased dimpling just above the knee. 3. Tighten these muscles even more and raise your leg 4-6 inches (10-15 cm) off the floor. 4. Hold for 3 seconds. 5. Keep the thigh muscles tense as you lower your leg. 6. Relax the muscles slowly and completely after each repetition. Repeat 2 times. Complete this exercise 3 times a week. Exercise D: Straight leg raises (hip extensors) 1. Lie on your belly on a bed or a firm surface with a pillow under your hips. 2. Bend your left / right knee so your foot is straight up in the air. 3. Tense your buttock muscles and lift your left / right thigh off the bed. Do not let your back arch. 4. Hold this position for 3 seconds. 5. Slowly return to the starting position. Let your muscles relax completely before doing another repetition. Repeat 2 times. Complete this exercise 3 times a week. Exercise E: Wall sits   Follow the directions for form closely. If you do not place your feet and knees properly, this can lead to knee pain. 1. Lean back against a smooth wall or door and walk your feet out 18-24 inches (46-61 cm) from it. Place your feet hip-width apart. 2. Slowly slide down the wall or door until your knees bend  60-90 degrees. Keep your weight back and over your heels, not over your toes. Keep your thighs straight or pointing slightly  outward. 3. Hold for 1 second. 4. Use your thigh and buttock muscles to push you back up to a standing position. Keep your weight through your heels while you do this. 5. Rest for 5 seconds in between repetitions. Repeat 2 times. Complete this exercise 3 times a week. Make sure you discuss any questions you have with your health care provider. Document Released: 06/24/2005 Document Revised: 02/29/2016 Document Reviewed: 03/28/2015 Elsevier Interactive Patient Education  [employer].

## 2019-04-21 NOTE — Progress Notes (Signed)
Chief Complaint  Patient presents with  . Annual Exam     Well Woman Hannah Keith is here for a complete physical.   Her last physical was >1 year ago.  Current diet: in general, diet could be better. Current exercise: walks dogs. Weight is stable and she denies daytime fatigue. Seatbelt? Yes  Patient has had upper respiratory tract infections twice this year.  Most recent one was early September.  She would like to have covid antibody testing.  We had a discussion regarding what this entails and limitations to this testing.  The patient has a several week history of right thigh pain.  She has pain on the outside of her hip in addition to the front of her thigh.  There is no injury or change in activity.  The front of her thigh hurts when she brings up her leg to tie her shoe.  She has tried intermittent ibuprofen that helps sporadically.  No weakness, numbness, tingling.  Health Maintenance Pap/HPV- No Mammogram- No Tetanus- Yes HIV screening- Yes  Past Medical History:  Diagnosis Date  . Anxiety   . Depression   . History of chicken pox   . Panic attacks   . PTSD (post-traumatic stress disorder)   . Vaginal Pap smear, abnormal      Past Surgical History:  Procedure Laterality Date  . LEEP  2001-2002  . NO PAST SURGERIES      Medications  Current Outpatient Medications on File Prior to Visit  Medication Sig Dispense Refill  . amphetamine-dextroamphetamine (ADDERALL) 20 MG tablet Take 20 mg by mouth 3 (three) times daily.    Marland Kitchen escitalopram (LEXAPRO) 10 MG tablet Take 10 mg by mouth daily.    . traZODone (DESYREL) 50 MG tablet Take 50 mg by mouth at bedtime as needed for sleep.    Marland Kitchen aluminum chloride (DRYSOL) 20 % external solution Apply topically at bedtime. 35 mL 0   Allergies Allergies  Allergen Reactions  . Nsaids     Cause GI Issues in large amounts  . Penicillins Rash    Review of Systems: Constitutional:  no unexpected weight changes Eye:  no  recent significant change in vision Ear/Nose/Mouth/Throat:  Ears:  no tinnitus or vertigo and no recent change in hearing Nose/Mouth/Throat:  no complaints of nasal congestion, no sore throat Cardiovascular: no chest pain Respiratory:  no cough and no shortness of breath Gastrointestinal:  no abdominal pain, no change in bowel habits GU:  Female: negative for dysuria or pelvic pain Musculoskeletal/Extremities: R thigh pain; otherwise no pain of the joints Integumentary (Skin/Breast):  no abnormal skin lesions reported Neurologic:  no headaches Endocrine:  denies fatigue Hematologic/Lymphatic:  No areas of easy bleeding  Exam BP 110/62 (BP Location: Left Arm, Patient Position: Sitting, Cuff Size: Normal)   Pulse 95   Temp 97.9 F (36.6 C) (Temporal)   Ht 5\' 4"  (1.626 m)   Wt 174 lb (78.9 kg)   SpO2 96%   BMI 29.87 kg/m  General:  well developed, well nourished, in no apparent distress Skin:  no significant moles, warts, or growths Head:  no masses, lesions, or tenderness Eyes:  pupils equal and round, sclera anicteric without injection Ears:  canals without lesions, TMs shiny without retraction, no obvious effusion, no erythema Nose:  nares patent, septum midline, mucosa normal, and no drainage or sinus tenderness Throat/Pharynx:  lips and gingiva without lesion; tongue and uvula midline; non-inflamed pharynx; no exudates or postnasal drainage Neck: neck supple without  adenopathy, thyromegaly, or masses Lungs:  clear to auscultation, breath sounds equal bilaterally, no respiratory distress Cardio:  regular rate and rhythm, no bruits, no LE edema Abdomen:  abdomen soft, nontender; bowel sounds normal; no masses or organomegaly Genital: Defer to GYN Musculoskeletal: + Tenderness over the right greater trochanteric bursa; no tenderness over the right thigh; negative Stinchfield, logroll, FADDIR/Faber; symmetrical muscle groups noted without atrophy or deformity Extremities:  no  clubbing, cyanosis, or edema, no deformities, no skin discoloration Neuro:  gait normal; deep tendon reflexes normal and symmetric Psych: well oriented with normal range of affect and appropriate judgment/insight  Assessment and Plan  Well adult exam - Plan: CBC, Comprehensive metabolic panel, Lipid panel  Screen for colon cancer - Plan: Ambulatory referral to Gastroenterology  Encounter for screening mammogram for malignant neoplasm of breast - Plan: MM 3D SCREEN BREAST BILATERAL, CANCELED: MM DIGITAL SCREENING BILATERAL  Need for influenza vaccination - Plan: Flu Vaccine QUAD 6+ mos PF IM (Fluarix Quad PF)  Encounter for laboratory testing for COVID-19 virus - Plan: SAR CoV2 Serology (COVID 19)AB(IGG)IA  Greater trochanteric bursitis of right hip  Right thigh pain   Well 48 y.o. female. Counseled on diet and exercise. Stretches and exercises given for both quadriceps and IT band.  I suspect a sartorius strain.  If no improvement in the bursa, will inject.  If no improvement with the sartorius, will refer to physical therapy.  Heat, ice, Tylenol. After counseling on the risks and benefits of COVID ended by testing, she agreed to undergo testing. Other orders as above. Follow up in 1 yr or prn. The patient voiced understanding and agreement to the plan.  [place], DO 04/21/19 3:37 PM

## 2019-04-22 LAB — COMPREHENSIVE METABOLIC PANEL
ALT: 11 U/L (ref 0–35)
AST: 10 U/L (ref 0–37)
Albumin: 4.3 g/dL (ref 3.5–5.2)
Alkaline Phosphatase: 67 U/L (ref 39–117)
BUN: 12 mg/dL (ref 6–23)
CO2: 27 mEq/L (ref 19–32)
Calcium: 9.6 mg/dL (ref 8.4–10.5)
Chloride: 106 mEq/L (ref 96–112)
Creatinine, Ser: 0.78 mg/dL (ref 0.40–1.20)
GFR: 78.05 mL/min (ref >60.00)
Glucose, Bld: 90 mg/dL (ref 70–99)
Potassium: 4.1 mEq/L (ref 3.5–5.1)
Sodium: 140 mEq/L (ref 135–145)
Total Bilirubin: 0.5 mg/dL (ref 0.2–1.2)
Total Protein: 6.6 g/dL (ref 6.0–8.3)

## 2019-04-22 LAB — LIPID PANEL
Cholesterol: 189 mg/dL (ref 0–200)
HDL: 51 mg/dL (ref >39.00)
LDL Cholesterol: 118 mg/dL — ABNORMAL HIGH (ref 0–99)
NonHDL: 138.43
Total CHOL/HDL Ratio: 4
Triglycerides: 102 mg/dL (ref 0.0–149.0)
VLDL: 20.4 mg/dL (ref 0.0–40.0)

## 2019-04-22 LAB — CBC
HCT: 40.2 % (ref 36.0–46.0)
Hemoglobin: 13.5 g/dL (ref 12.0–15.0)
MCHC: 33.5 g/dL (ref 30.0–36.0)
MCV: 94.6 fl (ref 78.0–100.0)
Platelets: 275 10*3/uL (ref 150.0–400.0)
RBC: 4.26 Mil/uL (ref 3.87–5.11)
RDW: 15.1 % (ref 11.5–15.5)
WBC: 6.7 10*3/uL (ref 4.0–10.5)

## 2019-04-22 LAB — SAR COV2 SEROLOGY (COVID19)AB(IGG),IA: SARS CoV2 AB IGG: NEGATIVE

## 2019-04-26 ENCOUNTER — Other Ambulatory Visit: Payer: Self-pay

## 2019-04-26 ENCOUNTER — Ambulatory Visit (HOSPITAL_BASED_OUTPATIENT_CLINIC_OR_DEPARTMENT_OTHER)
Admission: RE | Admit: 2019-04-26 | Discharge: 2019-04-26 | Disposition: A | Discharge status: Home / Self Care | Payer: 59 | Source: Ambulatory Visit | Attending: Family Medicine | Admitting: Family Medicine

## 2019-04-26 ENCOUNTER — Encounter (HOSPITAL_BASED_OUTPATIENT_CLINIC_OR_DEPARTMENT_OTHER): Payer: Self-pay

## 2019-04-26 DIAGNOSIS — Z1231 Encounter for screening mammogram for malignant neoplasm of breast: Secondary | ICD-10-CM | POA: Diagnosis not present

## 2019-04-27 ENCOUNTER — Other Ambulatory Visit: Payer: Self-pay | Admitting: Family Medicine

## 2019-04-27 ENCOUNTER — Telehealth: Payer: Self-pay

## 2019-04-27 DIAGNOSIS — R928 Other abnormal and inconclusive findings on diagnostic imaging of breast: Secondary | ICD-10-CM

## 2019-04-27 NOTE — Telephone Encounter (Signed)
Results released.... Patient informed

## 2019-04-27 NOTE — Telephone Encounter (Signed)
Pt would like her labs dated 04/21/19 released to [facility].

## 2019-04-30 ENCOUNTER — Ambulatory Visit
Admission: RE | Admit: 2019-04-30 | Discharge: 2019-04-30 | Disposition: A | Discharge status: Home / Self Care | Payer: 59 | Source: Ambulatory Visit | Attending: Family Medicine | Admitting: Family Medicine

## 2019-04-30 ENCOUNTER — Ambulatory Visit: Payer: 59

## 2019-04-30 ENCOUNTER — Other Ambulatory Visit: Payer: Self-pay

## 2019-04-30 DIAGNOSIS — R928 Other abnormal and inconclusive findings on diagnostic imaging of breast: Secondary | ICD-10-CM

## 2019-08-02 ENCOUNTER — Encounter: Payer: Self-pay | Admitting: Family Medicine

## 2020-04-21 ENCOUNTER — Encounter: Payer: Self-pay | Admitting: Family Medicine

## 2020-04-21 ENCOUNTER — Ambulatory Visit (INDEPENDENT_AMBULATORY_CARE_PROVIDER_SITE_OTHER): Payer: 59 | Admitting: Family Medicine

## 2020-04-21 ENCOUNTER — Other Ambulatory Visit: Payer: Self-pay

## 2020-04-21 VITALS — BP 120/64 | HR 81 | Temp 98.5°F | Ht 64.0 in | Wt 162.5 lb

## 2020-04-21 DIAGNOSIS — Z1211 Encounter for screening for malignant neoplasm of colon: Secondary | ICD-10-CM | POA: Diagnosis not present

## 2020-04-21 DIAGNOSIS — R252 Cramp and spasm: Secondary | ICD-10-CM | POA: Diagnosis not present

## 2020-04-21 DIAGNOSIS — Z0000 Encounter for general adult medical examination without abnormal findings: Secondary | ICD-10-CM | POA: Diagnosis not present

## 2020-04-21 DIAGNOSIS — Z1159 Encounter for screening for other viral diseases: Secondary | ICD-10-CM | POA: Diagnosis not present

## 2020-04-21 DIAGNOSIS — R339 Retention of urine, unspecified: Secondary | ICD-10-CM | POA: Diagnosis not present

## 2020-04-21 DIAGNOSIS — Z23 Encounter for immunization: Secondary | ICD-10-CM | POA: Diagnosis not present

## 2020-04-21 NOTE — Patient Instructions (Addendum)
Give [place] 2-3 business days to get the results of your labs back.   Keep the diet clean and stay active.  Call Center for [place] at [facility] at [phone] for an appointment with Dr. Glenna Durand.  They are located at [address], [address], [place], [place], [address] (right across the hall from our office).  If you do not hear anything about your referral in the next 1-2 weeks, call our office and ask for an update.  Let us know if you need anything.

## 2020-04-21 NOTE — Progress Notes (Signed)
Chief Complaint  Patient presents with  . Annual Exam     Well Woman Hannah Keith [address] Hannah Keith is here for a complete physical.   Her last physical was >1 year ago.  Current diet: in general, a "healthy" diet. Current exercise: walking. Weight is intentionally down and she denies fatigue out of ordinary.  Seatbelt? Yes  Patient has over 2 years of urinary retention.  She will urinate, have to wait 3 minutes and then urinate again.  She does not feel like she fully empties.  She is not having any bleeding, discharge, or pain.  She will have intermittent lower abdominal pain but does not seem to be associated with this.  No injury or recent surgeries.  She is on Lexapro, trazodone, and Adderall from her psychiatrist.  No recent changes.  This is getting worse.  The patient also endorses nighttime cramping in her feet.  She does not always stay well-hydrated.  She has been taking magnesium and potassium, which is started.  She does not routinely stretch.  Health Maintenance Pap/HPV- Due Mammogram- Yes Colon cancer screening-No Shingrix- No Tetanus- Yes Hep C screening- No HIV screening- Yes  Past Medical History:  Diagnosis Date  . Anxiety   . Depression   . History of chicken pox   . Panic attacks   . PTSD (post-traumatic stress disorder)   . Vaginal Pap smear, abnormal      Past Surgical History:  Procedure Laterality Date  . LEEP  2001-2002  . NO PAST SURGERIES      Medications  Current Outpatient Medications on File Prior to Visit  Medication Sig Dispense Refill  . aluminum chloride (DRYSOL) 20 % external solution Apply topically at bedtime. 35 mL 0  . amphetamine-dextroamphetamine (ADDERALL) 20 MG tablet Take 20 mg by mouth 3 (three) times daily.    Marland Kitchen escitalopram (LEXAPRO) 10 MG tablet Take 10 mg by mouth daily.    . traZODone (DESYREL) 50 MG tablet Take 50 mg by mouth at bedtime as needed for sleep.      Allergies Allergies  Allergen Reactions  . Nsaids      Cause GI Issues in large amounts  . Penicillins Rash    Review of Systems: Constitutional:  no unexpected weight changes Eye:  no recent significant change in vision Ear/Nose/Mouth/Throat:  Ears:  no recent change in hearing Nose/Mouth/Throat:  no complaints of nasal congestion, no sore throat Cardiovascular: no chest pain Respiratory:  no shortness of breath Gastrointestinal:  no abdominal pain, no change in bowel habits GU:  Female: + Retention Musculoskeletal/Extremities:  no pain of the joints Integumentary (Skin/Breast):  no abnormal skin lesions reported Neurologic:  no headaches Endocrine:  denies fatigue  Exam BP 120/64 (BP Location: Left Arm, Patient Position: Sitting, Cuff Size: Normal)   Pulse 81   Temp 98.5 F (36.9 C) (Oral)   Ht 5\' 4"  (1.626 m)   Wt 162 lb 8 oz (73.7 kg)   SpO2 96%   BMI 27.89 kg/m  General:  well developed, well nourished, in no apparent distress Skin:  no significant moles, warts, or growths Head:  no masses, lesions, or tenderness Eyes:  pupils equal and round, sclera anicteric without injection Ears:  canals without lesions, TMs shiny without retraction, no obvious effusion, no erythema Nose:  nares patent, septum midline, mucosa normal, and no drainage or sinus tenderness Throat/Pharynx:  lips and gingiva without lesion; tongue and uvula midline; non-inflamed pharynx; no exudates or postnasal drainage Neck: neck supple without  adenopathy, thyromegaly, or masses Lungs:  clear to auscultation, breath sounds equal bilaterally, no respiratory distress Cardio:  regular rate and rhythm, no LE edema Abdomen:  abdomen soft, nontender; bowel sounds normal; no masses or organomegaly Genital: Defer to GYN Musculoskeletal:  symmetrical muscle groups noted without atrophy or deformity Extremities:  no clubbing, cyanosis, or edema, no deformities, no skin discoloration Neuro:  gait normal; deep tendon reflexes normal and symmetric Psych: well  oriented with normal range of affect and appropriate judgment/insight  Assessment and Plan  Well adult exam - Plan: Comprehensive metabolic panel, Lipid panel, CANCELED: Comprehensive metabolic panel, CANCELED: Lipid panel  Need for influenza vaccination - Plan: Flu Vaccine QUAD 6+ mos PF IM (Fluarix Quad PF)  Encounter for hepatitis C screening test for low risk patient - Plan: Hepatitis C antibody  Screening for colon cancer - Plan: Ambulatory referral to Gastroenterology  Urinary retention - Plan: Urinalysis, Ambulatory referral to Urology  Cramping of feet - Plan: Magnesium  Need for shingles vaccine - Plan: Varicella-zoster vaccine IM (Shingrix)   Well 49 y.o. female. Counseled on diet and exercise. Other orders as above. Check urine today. Refer to urology to further evaluate urinary retention. Ensure adequate hydration, check electrolytes and magnesium today. Stretch. It will be interesting to se how she responds to supplemental magnesium and potassium. Could consider physical therapy or muscle relaxant in the future. Follow up in 1 yr. The patient voiced understanding and agreement to the plan.  Hannah [place], DO 04/21/20 12:00 PM

## 2020-04-22 LAB — URINALYSIS
Bilirubin Urine: NEGATIVE
Glucose, UA: NEGATIVE
Hgb urine dipstick: NEGATIVE
Ketones, ur: NEGATIVE
Leukocytes,Ua: NEGATIVE
Nitrite: NEGATIVE
Protein, ur: NEGATIVE
Specific Gravity, Urine: 1.005 (ref 1.001–1.03)
pH: 6.5 (ref 5.0–8.0)

## 2020-04-24 ENCOUNTER — Encounter: Payer: Self-pay | Admitting: Gastroenterology

## 2020-04-24 LAB — COMPREHENSIVE METABOLIC PANEL
AG Ratio: 1.8 (calc) (ref 1.0–2.5)
ALT: 11 U/L (ref 6–29)
AST: 13 U/L (ref 10–35)
Albumin: 4.2 g/dL (ref 3.6–5.1)
Alkaline phosphatase (APISO): 67 U/L (ref 37–153)
BUN: 16 mg/dL (ref 7–25)
CO2: 24 mmol/L (ref 20–32)
Calcium: 9.4 mg/dL (ref 8.6–10.4)
Chloride: 107 mmol/L (ref 98–110)
Creat: 0.8 mg/dL (ref 0.50–1.05)
Globulin: 2.4 g/dL (calc) (ref 1.9–3.7)
Glucose, Bld: 95 mg/dL (ref 65–99)
Potassium: 4.1 mmol/L (ref 3.5–5.3)
Sodium: 141 mmol/L (ref 135–146)
Total Bilirubin: 0.4 mg/dL (ref 0.2–1.2)
Total Protein: 6.6 g/dL (ref 6.1–8.1)

## 2020-04-24 LAB — LIPID PANEL
Cholesterol: 193 mg/dL (ref <200)
HDL: 61 mg/dL (ref >=50)
LDL Cholesterol (Calc): 118 mg/dL (calc) — ABNORMAL HIGH
Non-HDL Cholesterol (Calc): 132 mg/dL (calc) — ABNORMAL HIGH (ref <130)
Total CHOL/HDL Ratio: 3.2 (calc) (ref <5.0)
Triglycerides: 53 mg/dL (ref <150)

## 2020-04-24 LAB — MAGNESIUM: Magnesium: 2 mg/dL (ref 1.5–2.5)

## 2020-04-24 LAB — HEPATITIS C ANTIBODY
Hepatitis C Ab: NONREACTIVE
SIGNAL TO CUT-OFF: 0.02 (ref <1.00)

## 2020-06-12 ENCOUNTER — Ambulatory Visit (AMBULATORY_SURGERY_CENTER): Payer: Self-pay | Admitting: *Deleted

## 2020-06-12 ENCOUNTER — Other Ambulatory Visit: Payer: Self-pay

## 2020-06-12 VITALS — Ht 64.0 in | Wt 163.0 lb

## 2020-06-12 DIAGNOSIS — Z1211 Encounter for screening for malignant neoplasm of colon: Secondary | ICD-10-CM

## 2020-06-12 MED ORDER — CLENPIQ 10-3.5-12 MG-GM -GM/160ML PO SOLN: 1.0000 | ORAL | 0 refills | Status: DC

## 2020-06-12 NOTE — Progress Notes (Signed)
Patient is here in-person for PV. Patient denies any allergies to eggs or soy. Patient denies any problems with anesthesia/sedation. Patient denies any oxygen use at home. Patient denies taking any diet/weight loss medications or blood thinners. Patient is not being treated for MRSA or C-diff. Patient is aware of our care-partner policy and [id number] safety protocol. EMMI education assigned to the patient for the procedure, sent to [facility].   COVID-19 vaccines completed on 11/04/19 x2, per patient.   Prep Prescription coupon given to the patient.

## 2020-06-13 ENCOUNTER — Encounter: Payer: Self-pay | Admitting: Gastroenterology

## 2020-06-21 ENCOUNTER — Other Ambulatory Visit: Payer: Self-pay

## 2020-06-21 ENCOUNTER — Ambulatory Visit (INDEPENDENT_AMBULATORY_CARE_PROVIDER_SITE_OTHER): Payer: 59

## 2020-06-21 DIAGNOSIS — Z23 Encounter for immunization: Secondary | ICD-10-CM

## 2020-06-21 NOTE — Progress Notes (Signed)
Patient came in today for a zoster immunization per Dr.Wendling   Patient tolerated injection well , given in Left deltoid , IM .

## 2020-06-26 ENCOUNTER — Encounter: Payer: Self-pay | Admitting: Gastroenterology

## 2020-06-26 ENCOUNTER — Ambulatory Visit (AMBULATORY_SURGERY_CENTER): Payer: 59 | Admitting: Gastroenterology

## 2020-06-26 ENCOUNTER — Other Ambulatory Visit: Payer: Self-pay

## 2020-06-26 VITALS — BP 105/70 | HR 58 | Temp 98.4°F | Resp 16 | Ht 64.0 in | Wt 163.0 lb

## 2020-06-26 DIAGNOSIS — K635 Polyp of colon: Secondary | ICD-10-CM

## 2020-06-26 DIAGNOSIS — Z1211 Encounter for screening for malignant neoplasm of colon: Secondary | ICD-10-CM | POA: Diagnosis present

## 2020-06-26 DIAGNOSIS — K621 Rectal polyp: Secondary | ICD-10-CM

## 2020-06-26 DIAGNOSIS — D123 Benign neoplasm of transverse colon: Secondary | ICD-10-CM

## 2020-06-26 DIAGNOSIS — D128 Benign neoplasm of rectum: Secondary | ICD-10-CM

## 2020-06-26 DIAGNOSIS — K640 First degree hemorrhoids: Secondary | ICD-10-CM

## 2020-06-26 MED ORDER — SODIUM CHLORIDE 0.9 % IV SOLN: 500.0000 mL | Freq: Once | INTRAVENOUS | Status: DC

## 2020-06-26 NOTE — Progress Notes (Signed)
Pt's states no medical or surgical changes since previsit or office visit. 

## 2020-06-26 NOTE — Op Note (Signed)
[facility] Patient Name: Hannah Keith [id number] Procedure Date: 06/26/2020 9:11 AM MRN: [id number] Endoscopist: Gerrit Heck , MD Age: 49 Referring MD:  Date of Birth: [birth year] Gender: Female Account #: [id number] Procedure:                Colonoscopy Indications:              Screening for colorectal malignant neoplasm, This                            is the patient's first colonoscopy Medicines:                Monitored Anesthesia Care Procedure:                Pre-Anesthesia Assessment:                           - Prior to the procedure, a History and Physical                            was performed, and patient medications and                            allergies were reviewed. The patient's tolerance of                            previous anesthesia was also reviewed. The risks                            and benefits of the procedure and the sedation                            options and risks were discussed with the patient.                            All questions were answered, and informed consent                            was obtained. Prior Anticoagulants: The patient has                            taken no previous anticoagulant or antiplatelet                            agents. ASA Grade Assessment: II - A patient with                            mild systemic disease. After reviewing the risks                            and benefits, the patient was deemed in                            satisfactory condition to undergo the procedure.  After obtaining informed consent, the colonoscope                            was passed under direct vision. Throughout the                            procedure, the patient's blood pressure, pulse, and                            oxygen saturations were monitored continuously. The                            Olympus CF-HQ190L (Serial# [id number]) Colonoscope was                            introduced  through the anus and advanced to the the                            terminal ileum. The colonoscopy was performed                            without difficulty. The patient tolerated the                            procedure well. The quality of the bowel                            preparation was good. The terminal ileum, ileocecal                            valve, appendiceal orifice, and rectum were                            photographed. Scope In: 9:20:13 AM Scope Out: 9:51:51 AM Scope Withdrawal Time: 0 hours 28 minutes 14 seconds  Total Procedure Duration: 0 hours 31 minutes 38 seconds  Findings:                 The perianal and digital rectal examinations were                            normal.                           Two flat polyps were found in the hepatic flexure.                            The polyps were 5 to 8 mm in size and with adherent                            mucus cap. These polyps were removed with a cold                            snare. Resection and retrieval were complete.  Estimated blood loss was minimal.                           A 2 mm polyp was found in the transverse colon. The                            polyp was sessile. The polyp was removed with a                            cold biopsy forceps. Resection and retrieval were                            complete. Estimated blood loss was minimal.                           Multiple sessile polyps were found in the rectum.                            The polyps were 1 to 2 mm in size. 7 of these                            polyps were removed with a cold biopsy forceps for                            histologic representative evaluation. Resection and                            retrieval were complete. Estimated blood loss was                            minimal.                           Non-bleeding internal hemorrhoids were found during                            retroflexion. The  hemorrhoids were small and Grade                            I (internal hemorrhoids that do not prolapse).                           The terminal ileum appeared normal. Complications:            No immediate complications. Estimated Blood Loss:     Estimated blood loss was minimal. Impression:               - Two 5 to 8 mm polyps at the hepatic flexure,                            removed with a cold snare. Resected and retrieved.                           - One 2 mm polyp in  the transverse colon, removed                            with a cold biopsy forceps. Resected and retrieved.                           - Multiple 1 to 2 mm polyps in the rectum, removed                            with a cold biopsy forceps. Resected and retrieved.                           - Non-bleeding internal hemorrhoids.                           - The examined portion of the ileum was normal. Recommendation:           - Patient has a contact number available for                            emergencies. The signs and symptoms of potential                            delayed complications were discussed with the                            patient. Return to normal activities tomorrow.                            Written discharge instructions were provided to the                            patient.                           - Resume previous diet.                           - Continue present medications.                           - Await pathology results.                           - Repeat colonoscopy for surveillance based on                            pathology results.                           - Return to GI clinic PRN.                           - Use fiber, for example Citrucel, Fibercon, [employer]  or Metamucil. Gerrit Heck, MD 06/26/2020 9:57:47 AM

## 2020-06-26 NOTE — Progress Notes (Signed)
Report given to PACU, vss 

## 2020-06-26 NOTE — Progress Notes (Signed)
Called to room to assist during endoscopic procedure.  Patient ID and intended procedure confirmed with present staff. Received instructions for my participation in the procedure from the performing physician.  

## 2020-06-26 NOTE — Patient Instructions (Signed)
Handouts Provided:  Polyps  YOU HAD AN ENDOSCOPIC PROCEDURE TODAY AT THE [facility]:   Refer to the procedure report that was given to you for any specific questions about what was found during the examination.  If the procedure report does not answer your questions, please call your gastroenterologist to clarify.  If you requested that your care partner not be given the details of your procedure findings, then the procedure report has been included in a sealed envelope for you to review at your convenience later.  YOU SHOULD EXPECT: Some feelings of bloating in the abdomen. Passage of more gas than usual.  Walking can help get rid of the air that was put into your GI tract during the procedure and reduce the bloating. If you had a lower endoscopy (such as a colonoscopy or flexible sigmoidoscopy) you may notice spotting of blood in your stool or on the toilet paper. If you underwent a bowel prep for your procedure, you may not have a normal bowel movement for a few days.  Please Note:  You might notice some irritation and congestion in your nose or some drainage.  This is from the oxygen used during your procedure.  There is no need for concern and it should clear up in a day or so.  SYMPTOMS TO REPORT IMMEDIATELY:   Following lower endoscopy (colonoscopy or flexible sigmoidoscopy):  Excessive amounts of blood in the stool  Significant tenderness or worsening of abdominal pains  Swelling of the abdomen that is new, acute  Fever of 100F or higher  For urgent or emergent issues, a gastroenterologist can be reached at any hour by calling [phone]. Do not use MyChart messaging for urgent concerns.    DIET:  We do recommend a small meal at first, but then you may proceed to your regular diet.  Drink plenty of fluids but you should avoid alcoholic beverages for 24 hours.  ACTIVITY:  You should plan to take it easy for the rest of today and you should NOT DRIVE or use heavy  machinery until tomorrow (because of the sedation medicines used during the test).    FOLLOW UP: Our staff will call the number listed on your records 48-72 hours following your procedure to check on you and address any questions or concerns that you may have regarding the information given to you following your procedure. If we do not reach you, we will leave a message.  We will attempt to reach you two times.  During this call, we will ask if you have developed any symptoms of COVID 19. If you develop any symptoms (ie: fever, flu-like symptoms, shortness of breath, cough etc.) before then, please call [phone].  If you test positive for Covid 19 in the 2 weeks post procedure, please call and report this information to us.    If any biopsies were taken you will be contacted by phone or by letter within the next 1-3 weeks.  Please call us at [phone] if you have not heard about the biopsies in 3 weeks.    SIGNATURES/CONFIDENTIALITY: You and/or your care partner have signed paperwork which will be entered into your electronic medical record.  These signatures attest to the fact that that the information above on your After Visit Summary has been reviewed and is understood.  Full responsibility of the confidentiality of this discharge information lies with you and/or your care-partner.  

## 2020-06-29 ENCOUNTER — Telehealth: Payer: Self-pay

## 2020-06-29 NOTE — Telephone Encounter (Signed)
Post procedure follow up call, no answer 

## 2020-07-14 ENCOUNTER — Encounter: Payer: Self-pay | Admitting: Gastroenterology

## 2020-07-17 ENCOUNTER — Encounter: Payer: Self-pay | Admitting: Family Medicine

## 2020-07-17 ENCOUNTER — Other Ambulatory Visit: Payer: Self-pay

## 2020-07-17 ENCOUNTER — Telehealth (INDEPENDENT_AMBULATORY_CARE_PROVIDER_SITE_OTHER): Payer: 59 | Admitting: Family Medicine

## 2020-07-17 VITALS — BP 100/77 | HR 100 | Temp 98.3°F | Ht 64.0 in | Wt 165.2 lb

## 2020-07-17 DIAGNOSIS — U071 COVID-19: Secondary | ICD-10-CM | POA: Diagnosis not present

## 2020-07-17 DIAGNOSIS — J208 Acute bronchitis due to other specified organisms: Secondary | ICD-10-CM | POA: Diagnosis not present

## 2020-07-17 MED ORDER — AZITHROMYCIN 250 MG PO TABS: ORAL_TABLET | ORAL | 0 refills | Status: DC

## 2020-07-17 MED ORDER — PROMETHAZINE-DM 6.25-15 MG/5ML PO SYRP: 5.0000 mL | ORAL_SOLUTION | Freq: Four times a day (QID) | ORAL | 0 refills | Status: DC | PRN

## 2020-07-17 MED ORDER — PREDNISONE 10 MG PO TABS: ORAL_TABLET | ORAL | 0 refills | Status: DC

## 2020-07-17 NOTE — Progress Notes (Signed)
Virtual Visit via Video Note  I connected with Hannah Keith on 07/17/20 at  1:20 PM EST by a video enabled telemedicine application and verified that I am speaking with the correct person using two identifiers.  Location/ people in visit Patient: home with son Provider: office    I discussed the limitations of evaluation and management by telemedicine and the availability of in person appointments. The patient expressed understanding and agreed to proceed.  History of Present Illness: Pt is home c/o + covid on Wed.   She now has tightness in chest  + wheezing,  + fevers 100.8 yesterday-- today 99.2   + body aches  + nasal / sinus congestion with yellow/ green mucus,  Unable to cough anything up but feels like she has mucus that needs to come up  No sob at rest  Take tylenol 2x  A day' Her son was also + for covid and pneumonia and is home with her   Past Medical History:  Diagnosis Date  . Anxiety   . Depression   . History of chicken pox   . Panic attacks   . PTSD (post-traumatic stress disorder)   . Vaginal Pap smear, abnormal    Outpatient Encounter Medications as of 07/17/2020  Medication Sig  . acetaminophen (TYLENOL) 325 MG tablet Take 650 mg by mouth every 6 (six) hours as needed.  Marland Kitchen amphetamine-dextroamphetamine (ADDERALL) 20 MG tablet Take 20 mg by mouth 3 (three) times daily.  Marland Kitchen azithromycin (ZITHROMAX Z-PAK) 250 MG tablet As directed  . escitalopram (LEXAPRO) 10 MG tablet Take 10 mg by mouth daily.  Marland Kitchen MAGNESIUM PO Take by mouth.  . Multiple Vitamins-Minerals (ONE-A-DAY WOMENS PO) Take by mouth.  . Potassium (POTASSIMIN PO) Take by mouth.  . predniSONE (DELTASONE) 10 MG tablet TAKE 3 TABLETS PO QD FOR 3 DAYS THEN TAKE 2 TABLETS PO QD FOR 3 DAYS THEN TAKE 1 TABLET PO QD FOR 3 DAYS THEN TAKE 1/2 TAB PO QD FOR 3 DAYS  . promethazine-dextromethorphan (PROMETHAZINE-DM) 6.25-15 MG/5ML syrup Take 5 mLs by mouth 4 (four) times daily as needed.  . traZODone (DESYREL)  50 MG tablet Take 50 mg by mouth at bedtime as needed for sleep.   No facility-administered encounter medications on file as of 07/17/2020.   Observations/Objective: Vitals:   07/17/20 1315  BP: 100/77  Pulse: 100  Temp: 98.3 F (36.8 C)   Pt is in nad  No sob   Assessment and Plan: 1. Acute bronchitis due to COVID-19 virus Tested + last Wed--- pt symptoms worsened Will keep her out of work this week as well  Pt instructed to call if symptoms do not improve or if they worsen and we will set her up with resp clinic  - azithromycin (ZITHROMAX Z-PAK) 250 MG tablet; As directed  Dispense: 6 each; Refill: 0 - predniSONE (DELTASONE) 10 MG tablet; TAKE 3 TABLETS PO QD FOR 3 DAYS THEN TAKE 2 TABLETS PO QD FOR 3 DAYS THEN TAKE 1 TABLET PO QD FOR 3 DAYS THEN TAKE 1/2 TAB PO QD FOR 3 DAYS  Dispense: 20 tablet; Refill: 0 - promethazine-dextromethorphan (PROMETHAZINE-DM) 6.25-15 MG/5ML syrup; Take 5 mLs by mouth 4 (four) times daily as needed.  Dispense: 118 mL; Refill: 0   Follow Up Instructions:    I discussed the assessment and treatment plan with the patient. The patient was provided an opportunity to ask questions and all were answered. The patient agreed with the plan and demonstrated an understanding of  the instructions.   The patient was advised to call back or seek an in-person evaluation if the symptoms worsen or if the condition fails to improve as anticipated.  I provided 25 minutes of non-face-to-face time during this encounter.   Ann Held, DO

## 2020-07-19 ENCOUNTER — Ambulatory Visit (INDEPENDENT_AMBULATORY_CARE_PROVIDER_SITE_OTHER): Payer: 59 | Admitting: Medical

## 2020-07-19 VITALS — HR 76 | Temp 98.4°F

## 2020-07-19 DIAGNOSIS — R059 Cough, unspecified: Secondary | ICD-10-CM

## 2020-07-19 DIAGNOSIS — U071 COVID-19: Secondary | ICD-10-CM

## 2020-07-19 DIAGNOSIS — R0602 Shortness of breath: Secondary | ICD-10-CM

## 2020-07-19 MED ORDER — EMERGEN-C IMMUNE PLUS PO PACK: 1.0000 | PACK | Freq: Two times a day (BID) | ORAL | 0 refills | Status: DC

## 2020-07-19 MED ORDER — ALBUTEROL SULFATE HFA 108 (90 BASE) MCG/ACT IN AERS: 2.0000 | INHALATION_SPRAY | Freq: Four times a day (QID) | RESPIRATORY_TRACT | 0 refills | Status: DC | PRN

## 2020-07-19 NOTE — Progress Notes (Signed)
Subjective:  Hannah Keith is a 50 y.o. female who presents for respiratory illness.    PCP:  Shelda Pal, DO  Patient presents with 1 week history of symptoms.  Tested positive for COVID 1 week ago after her son and husband both tested positive.  She initially had no symptoms on the day of her positive test.  The next day she started getting symptoms. Initially symptoms were cough congestion, body aches, dry cough, headache, somewhat short of breath, sore throat, and over the last few days has a little bit more chest congestion now.  Pressure in the chest.  She is taking Tylenol.  She did a virtual consult 2 days ago with her primary care provider and was prescribed Z-Pak, prednisone, Promethazine DM.  She has begun the Z-Pak and prednisone but the Promethazine DM was on backorder and she just got it today.  She denies nausea, vomiting, diarrhea.  No chest pain.  No pain or swelling in the calves.  She knows she can do better with her hydration.  She is a smoker.  She did get the COVID-vaccine but not the booster.  No other aggravating or relieving factors.  No other c/o.  Past Medical History:  Diagnosis Date  . Anxiety   . Depression   . History of chicken pox   . Panic attacks   . PTSD (post-traumatic stress disorder)   . Vaginal Pap smear, abnormal     ROS as in subjective   Objective: Pulse 76   Temp 98.4 F (36.9 C)   SpO2 97%   General appearance: Alert, WD/WN, no distress, mildly ill appearing, white female                             Skin: warm, no rash                           Head: no sinus tenderness                            Eyes: conjunctiva normal, corneas clear, PERRLA                            Ears: pearly TMs, external ear canals normal                          Nose: septum midline, turbinates swollen, with erythema and clear discharge, right septum was localized swelling and irritation just inside the opening              Mouth/throat: MMM, tongue normal, mild pharyngeal erythema                           Neck: supple, no adenopathy, no thyromegaly, non tender, no JVD                          Heart: RRR, normal S1, S2, no murmurs                         Lungs: Mildly decreased breath sounds, scattered rhonchi but no wheezes or rales.  Extremities: No calf tenderness or swelling or asymmetry, negative Homans  Assessment  Encounter Diagnoses  Name Primary?  . COVID-19 virus infection Yes  . Cough   . SOB (shortness of breath)       Plan: Overall she has mild symptoms, 1 week into symptoms.  She was vaccinated.  She has a smoker.  She will go for x-ray tomorrow.  She will continue the medicines prescribed by her PCP 2 days ago including Z-Pak and prednisone and Promethazine DM.  I added an inhaler today and discussed proper use of medication.  I advised that her symptoms should gradually improve hopefully over the next 4 to 5 days.  We discussed instructions below  Patient Instructions   Go to [facility] tomorrow for chest xray between 8-4:30  Continue the new medications from the visit you had earlier today, Prednisone, Zpak, Promethazine DM cough syrup  You may begin Albuterol inhaler if needed, 2 puffs every 4-6 hours for cough, wheezing and shortness of breath    General recommendations if you have respiratory symptoms: We recommend you rest, hydrate well with water and clear fluids throughout the day such as water, soup broth, ice chips, or possibly Pedialyte or G2 no sugar gatorade.   You can use Tylenol over the counter for pain or fever every 4 - 6 hours You can use over the counter Delsym or mucinex DM for cough unless your provider prescribed a cough medication already. You can use over the counter Emetrol over the counter for nausea.    Consider EmergenC Immune plus vitamin pack over the counter which contains extra vitamin C, vitamin D, and zinc.  If you are having  trouble breathing, if you are very weak, have high fever 103 or higher consistently despite Tylenol, or uncontrollable nausea and vomiting, then call or go to the emergency department.    Covid symptoms such as fatigue and cough can linger over 2 weeks, even after the initial fever, aches, chills, and other initial symptoms.   Self Quarantine and Isolation: Log onto [employer] for up to date quarantine recommendations.   [email or web address].[email or web address]   If you test Covid +, regardless of vaccination status:  Stay home for 5 days. If you have no symptoms or your symptoms are resolving after 5 days, then you can leave your house. Continue to wear a mask around others for 5 additional days. If you have a fever, continue to stay home until your fever resolves.  This could take 7-10 days from onset of symptoms or longer in some cases. If you have lots of coughing, sneezing, and significant runny nose and congestion, continue to stay at home until your symptoms are resolving   If you were exposed to someone with Covid: If you: Have been boosted OR Completed the primary series of Pfizer or Moderna vaccine within the last 6 months OR Completed the primary series of J&J vaccine within the last 2 months  Wear a mask around others for 10 days.  Test on day 5, if possible.   If you test positive on day 5 or more after exposure, and no symptoms, then wear a mask around others for 10 days  If you test positive and have symptoms, then follow the positive covid result isolation recommendations above If you test negative and have no symptoms on day 5 after exposure, then you may end isolation and be around others    If you were exposed to someone with Covid: If you: Completed the primary series of Pfizer or Moderna vaccine over 6 months ago  and are not boosted OR Completed the primary series of J&J over 2 months ago and are not  boosted OR Are unvaccinated  Stay home for 5 days. After that continue to wear a mask around others for 5 additional days. If you can't quarantine you must wear a mask for 10 days. Test on day 5 if possible. If you test positive on day 5 or more after exposure, and no symptoms, then wear a mask around others for 10 days  If you test positive and have symptoms, then follow the positive covid result isolation recommendations above If you test negative and have no symptoms on day 5 after exposure, then you can end isolation but wear a mask around others for 5 more days   If you test Covid negative, but have respiratory symptoms:  Continue to wear a mask around others for 5 additional days. If you have a fever, continue to stay home until your fever resolves.   If you have lots of coughing, sneezing, and significant runny nose and congestion, continue to stay at home until your symptoms are resolving   Isolation means avoiding contact with people as much as possible.   Particularly in your house, isolate your self from others in a separate room, wear a mask when possible in the room, particularly if coughing a lot.   Have others bring food, water, medications, etc., to your door, but avoid direct contact with your household contacts during this time to avoid spreading the infection to them.   If you have a separate bathroom and living quarters during the next 2 weeks away from others, that would be preferable.    If you can't completely isolate, then wear a mask, wash hands frequently with soap and water for at least 15 seconds, minimize close contact with others, and have a friend or family member check regularly from a distance to make sure you are not getting seriously worse.     You should not be going out in public, should not be going to stores, to work or other public places until all your symptoms have resolved.  One of the goals is to limit spread to high risk people; people that are older  and elderly, people with multiple health issues like diabetes, heart disease, lung disease, and anybody that has weakened immune systems such as people with cancer or on immunosuppressive therapy.    Patient voiced understanding of diagnosis, recommendations, and treatment plan.  After visit summary given.   Diagnoses and all orders for this visit:  COVID-19 virus infection -     DG Chest 2 View; Future  Cough -     DG Chest 2 View; Future  SOB (shortness of breath) -     DG Chest 2 View; Future  Other orders -     albuterol (VENTOLIN HFA) 108 (90 Base) MCG/ACT inhaler; Inhale 2 puffs into the lungs every 6 (six) hours as needed for wheezing or shortness of breath. -     Multiple Vitamins-Minerals (EMERGEN-C IMMUNE PLUS) PACK; Take 1 tablet by mouth 2 (two) times daily.    f/u with pending chest xray

## 2020-07-19 NOTE — Patient Instructions (Addendum)
Go to [facility] tomorrow for chest xray between 8-4:30  Continue the new medications from the visit you had earlier today, Prednisone, Zpak, Promethazine DM cough syrup  You may begin Albuterol inhaler if needed, 2 puffs every 4-6 hours for cough, wheezing and shortness of breath    General recommendations if you have respiratory symptoms: We recommend you rest, hydrate well with water and clear fluids throughout the day such as water, soup broth, ice chips, or possibly Pedialyte or G2 no sugar gatorade.   You can use Tylenol over the counter for pain or fever every 4 - 6 hours You can use over the counter Delsym or mucinex DM for cough unless your provider prescribed a cough medication already. You can use over the counter Emetrol over the counter for nausea.    Consider EmergenC Immune plus vitamin pack over the counter which contains extra vitamin C, vitamin D, and zinc.  If you are having trouble breathing, if you are very weak, have high fever 103 or higher consistently despite Tylenol, or uncontrollable nausea and vomiting, then call or go to the emergency department.    Covid symptoms such as fatigue and cough can linger over 2 weeks, even after the initial fever, aches, chills, and other initial symptoms.   Self Quarantine and Isolation: Log onto [employer] for up to date quarantine recommendations.   [email or web address].[email or web address]   If you test Covid +, regardless of vaccination status:  Stay home for 5 days. If you have no symptoms or your symptoms are resolving after 5 days, then you can leave your house. Continue to wear a mask around others for 5 additional days. If you have a fever, continue to stay home until your fever resolves.  This could take 7-10 days from onset of symptoms or longer in some cases. If you have lots of coughing, sneezing, and significant runny nose and congestion, continue to stay at  home until your symptoms are resolving   If you were exposed to someone with Covid: If you: Have been boosted OR Completed the primary series of Pfizer or Moderna vaccine within the last 6 months OR Completed the primary series of J&J vaccine within the last 2 months  Wear a mask around others for 10 days.  Test on day 5, if possible.   If you test positive on day 5 or more after exposure, and no symptoms, then wear a mask around others for 10 days  If you test positive and have symptoms, then follow the positive covid result isolation recommendations above If you test negative and have no symptoms on day 5 after exposure, then you may end isolation and be around others    If you were exposed to someone with Covid: If you: Completed the primary series of Pfizer or Moderna vaccine over 6 months ago and are not boosted OR Completed the primary series of J&J over 2 months ago and are not boosted OR Are unvaccinated  Stay home for 5 days. After that continue to wear a mask around others for 5 additional days. If you can't quarantine you must wear a mask for 10 days. Test on day 5 if possible. If you test positive on day 5 or more after exposure, and no symptoms, then wear a mask around others for 10 days  If you test positive and have symptoms, then follow the positive covid result isolation recommendations above If you test negative and have no symptoms on day 5 after exposure, then you  can end isolation but wear a mask around others for 5 more days   If you test Covid negative, but have respiratory symptoms:  Continue to wear a mask around others for 5 additional days. If you have a fever, continue to stay home until your fever resolves.   If you have lots of coughing, sneezing, and significant runny nose and congestion, continue to stay at home until your symptoms are resolving   Isolation means avoiding contact with people as much as possible.   Particularly in your house,  isolate your self from others in a separate room, wear a mask when possible in the room, particularly if coughing a lot.   Have others bring food, water, medications, etc., to your door, but avoid direct contact with your household contacts during this time to avoid spreading the infection to them.   If you have a separate bathroom and living quarters during the next 2 weeks away from others, that would be preferable.    If you can't completely isolate, then wear a mask, wash hands frequently with soap and water for at least 15 seconds, minimize close contact with others, and have a friend or family member check regularly from a distance to make sure you are not getting seriously worse.     You should not be going out in public, should not be going to stores, to work or other public places until all your symptoms have resolved.  One of the goals is to limit spread to high risk people; people that are older and elderly, people with multiple health issues like diabetes, heart disease, lung disease, and anybody that has weakened immune systems such as people with cancer or on immunosuppressive therapy.

## 2020-07-20 ENCOUNTER — Ambulatory Visit (HOSPITAL_COMMUNITY)
Admission: RE | Admit: 2020-07-20 | Discharge: 2020-07-20 | Disposition: A | Discharge status: Home / Self Care | Payer: 59 | Source: Ambulatory Visit | Attending: Medical | Admitting: Medical

## 2020-07-20 ENCOUNTER — Other Ambulatory Visit: Payer: Self-pay

## 2020-07-20 DIAGNOSIS — R0602 Shortness of breath: Secondary | ICD-10-CM | POA: Insufficient documentation

## 2020-07-20 DIAGNOSIS — R059 Cough, unspecified: Secondary | ICD-10-CM | POA: Diagnosis present

## 2020-07-20 DIAGNOSIS — U071 COVID-19: Secondary | ICD-10-CM | POA: Insufficient documentation

## 2020-07-24 ENCOUNTER — Telehealth: Payer: 59 | Admitting: Nurse Practitioner

## 2020-07-24 DIAGNOSIS — U071 COVID-19: Secondary | ICD-10-CM

## 2020-07-24 NOTE — Progress Notes (Signed)
E-Visit for Corona Virus Screening  We are sorry you are not feeling well. We are here to help!  You have tested positive for COVID-19, meaning that you were infected with the novel coronavirus and could give the virus to others.  It is vitally important that you stay home so you do not spread it to others.      Please continue isolation at home, for at least 10 days since the start of your symptoms and until you have had 24 hours with no fever (without taking a fever reducer) and with improving of symptoms.  If you have no symptoms but tested positive (or all symptoms resolve after 5 days and you have no fever) you can leave your house but continue to wear a mask around others for an additional 5 days. If you have a fever,continue to stay home until you have had 24 hours of no fever. Most cases improve 5-10 days from onset but we have seen a small number of patients who have gotten worse after the 10 days.  Please be sure to watch for worsening symptoms and remain taking the proper precautions.   Go to the nearest hospital ED for assessment if fever/cough/breathlessness are severe or illness seems like a threat to life.    The following symptoms may appear 2-14 days after exposure: . Fever . Cough . Shortness of breath or difficulty breathing . Chills . Repeated shaking with chills . Muscle pain . Headache . Sore throat . New loss of taste or smell . Fatigue . Congestion or runny nose . Nausea or vomiting . Diarrhea  You have been enrolled in [facility] for COVID-19. Daily you will receive a questionnaire within the [facility] website. Our COVID-19 response team will be monitoring your responses daily.  You may also take acetaminophen (Tylenol) as needed for fever.  HOME CARE: . Only take medications as instructed by your medical team. . Drink plenty of fluids and get plenty of rest. . A steam or ultrasonic humidifier can help if you have congestion.   GET HELP RIGHT  AWAY IF YOU HAVE EMERGENCY WARNING SIGNS.  Call 911 or proceed to your closest emergency facility if: . You develop worsening high fever. . Trouble breathing . Bluish lips or face . Persistent pain or pressure in the chest . New confusion . Inability to wake or stay awake . You cough up blood. . Your symptoms become more severe . Inability to hold down food or fluids  This list is not all possible symptoms. Contact your medical provider for any symptoms that are severe or concerning to you.    Your e-visit answers were reviewed by a board certified advanced clinical practitioner to complete your personal care plan.  Depending on the condition, your plan could have included both over the counter or prescription medications.  If there is a problem please reply once you have received a response from your provider.  Your safety is important to [place].  If you have drug allergies check your prescription carefully.    You can use MyChart to ask questions about today's visit, request a non-urgent call back, or ask for a work or school excuse for 24 hours related to this e-Visit. If it has been greater than 24 hours you will need to follow up with your provider, or enter a new e-Visit to address those concerns. You will get an e-mail in the next two days asking about your experience.  I hope that your e-visit  has been valuable and will speed your recovery. Thank you for using e-visits.  I have spent at least 5 minutes reviewing and documenting in the patient's chart.

## 2020-08-06 ENCOUNTER — Other Ambulatory Visit: Payer: Self-pay | Admitting: Medical

## 2020-08-30 IMAGING — MG MM DIGITAL DIAGNOSTIC UNILAT*R* W/ TOMO
4 series · 4 of 12 positions shown · non-contrast
Comparison: Previous exam(s).

CLINICAL DATA: Patient returns today to evaluate a possible RIGHT
breast asymmetry questioned on recent screening mammogram.

EXAM:
DIGITAL DIAGNOSTIC UNILATERAL RIGHT MAMMOGRAM WITH CAD AND TOMO

[R ML synth-2D]
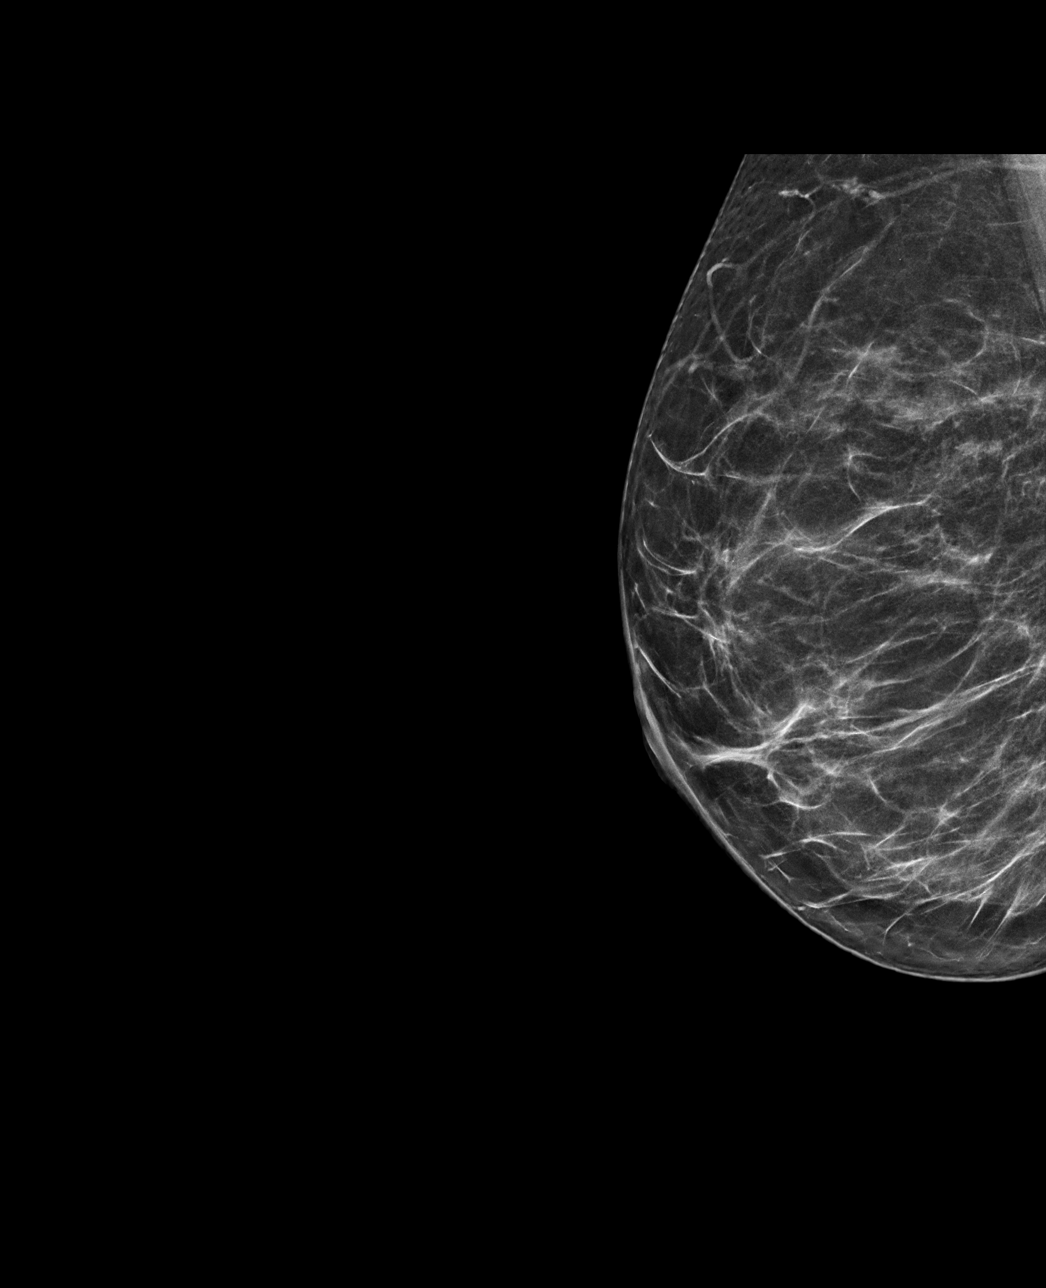

[R MLO synth-2D]
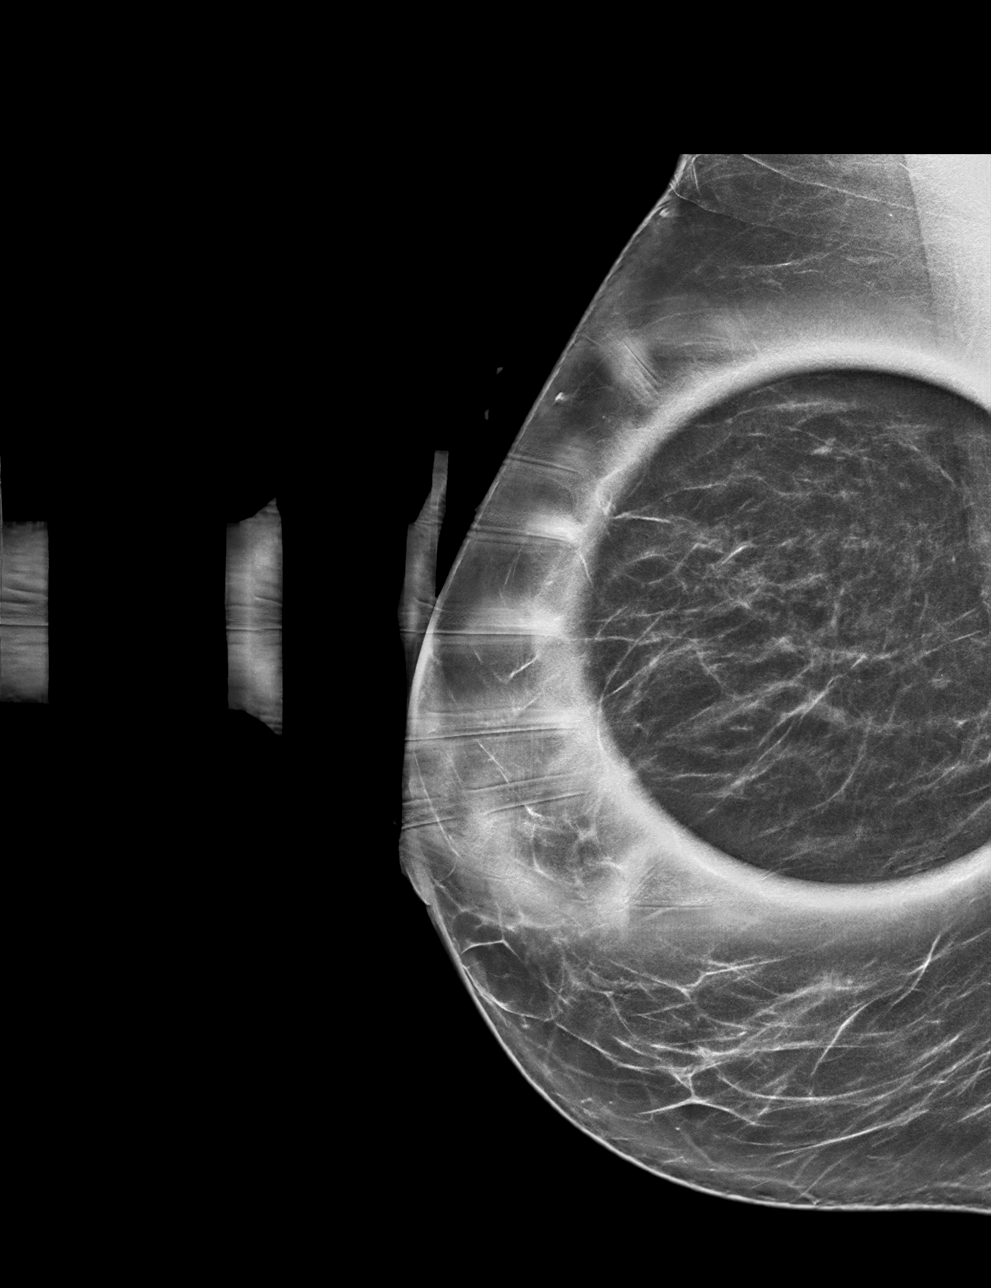

[R ML tomo · tomo slice 35/68.0]
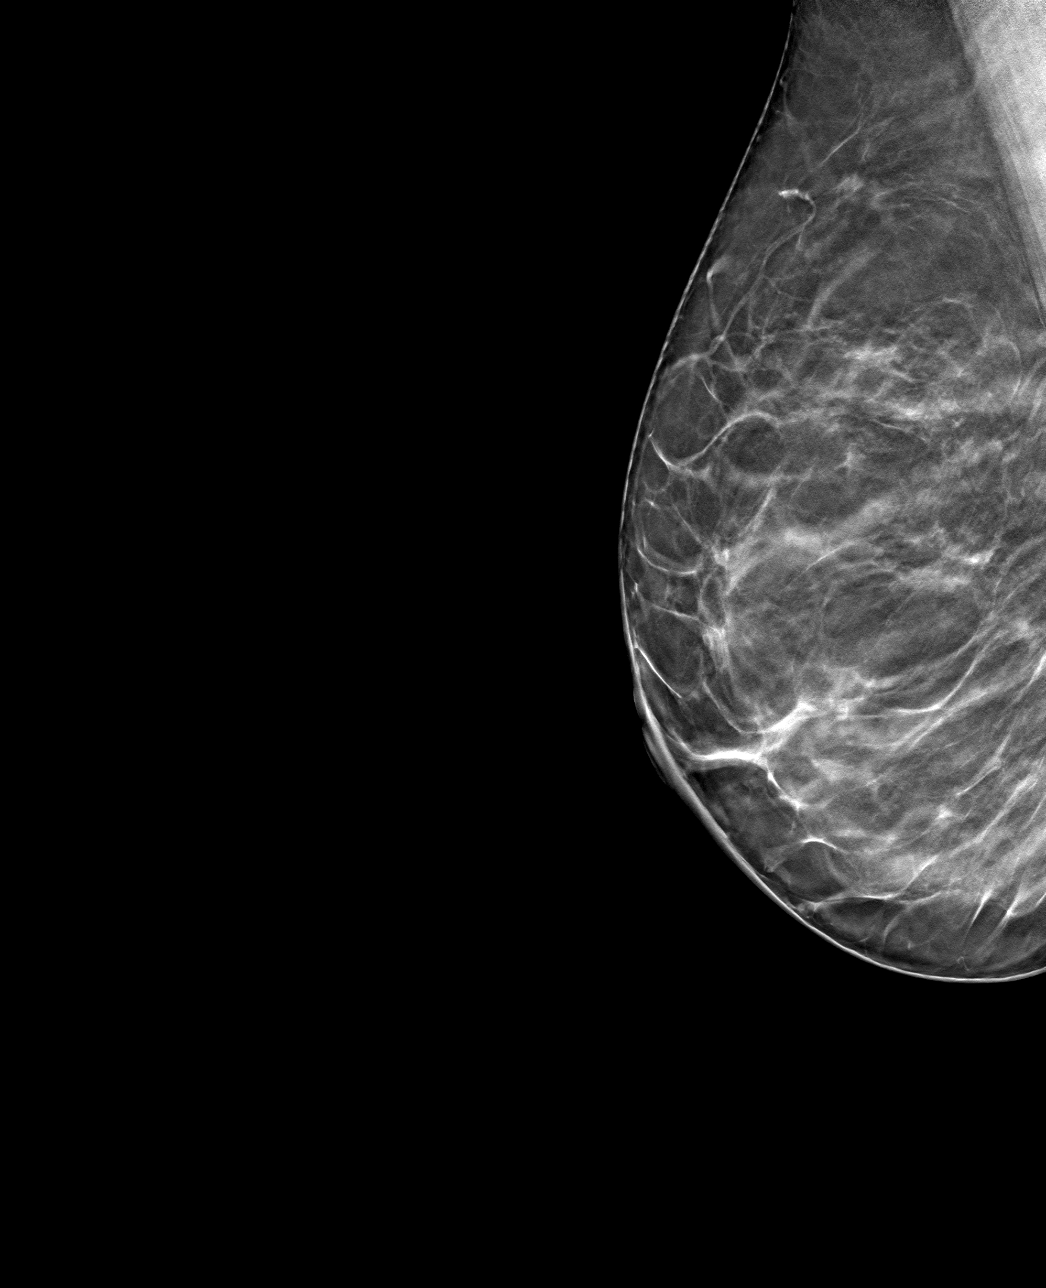

[R MLO tomo · tomo slice 35/68.0]
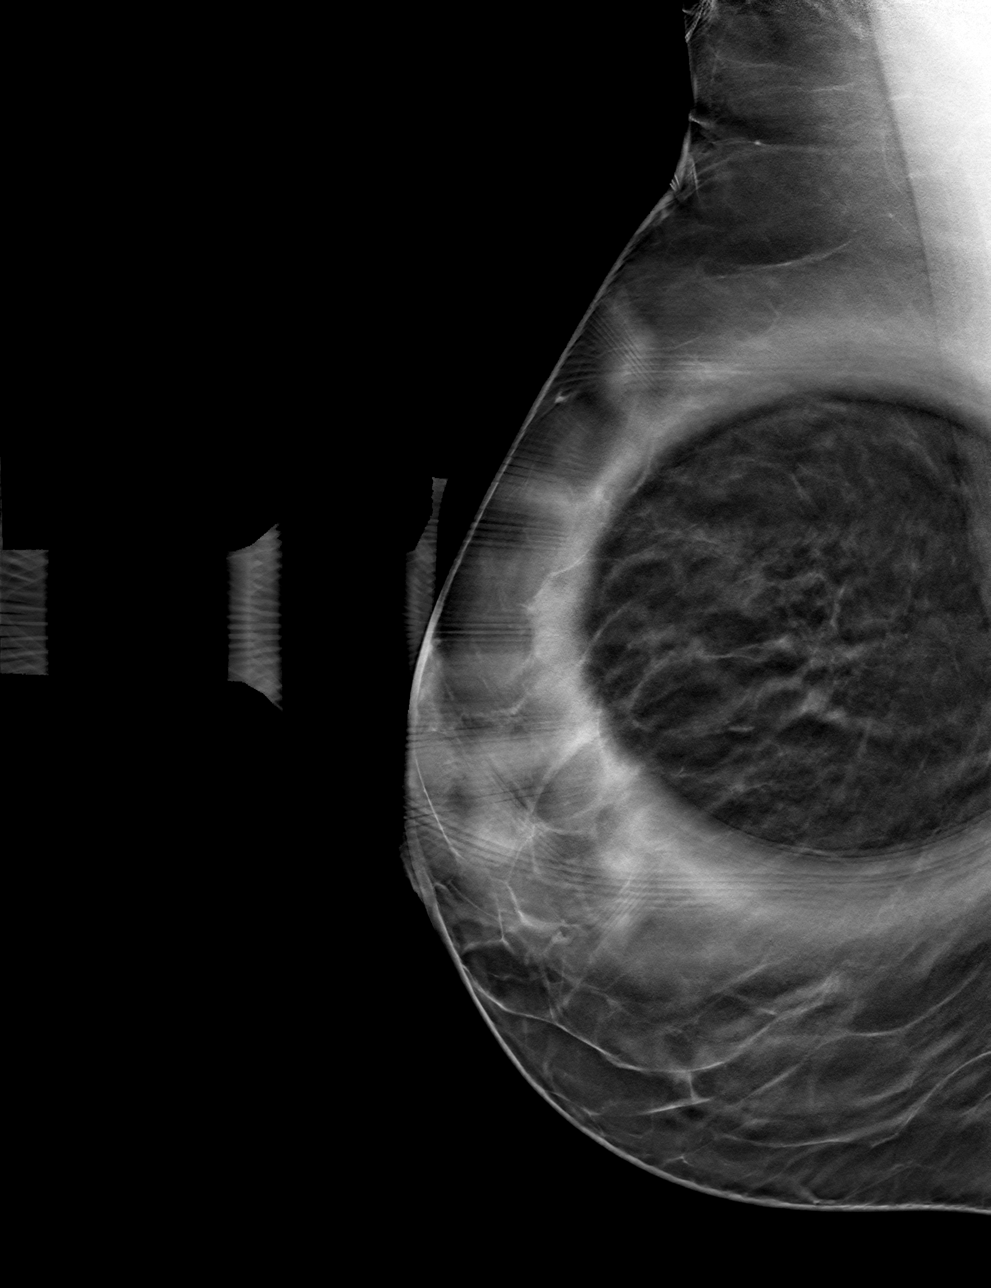

[4 of 12 positions shown; findings below may reference images not displayed]

ACR Breast Density Category b: There are scattered areas of
fibroglandular density.
FINDINGS: On today's additional diagnostic views, including spot compression
with 3D tomosynthesis, there is no persistent asymmetry within the
RIGHT breast indicating superimposition of normal fibroglandular
tissues. There are no new dominant masses, suspicious calcifications
or secondary signs of malignancy identified within the RIGHT breast
on today's exam.

Mammographic images were processed with CAD.
IMPRESSION: No evidence of malignancy. Patient may return to routine annual
bilateral screening mammogram schedule.

RECOMMENDATION:
1.  Screening mammogram in one year.(Code:[id number])
2. Given patient's family history of breast cancer, would consider
the addition of annual screening breast MRI to annual screening
mammography. Per American Cancer Society guidelines, annual
screening MRI of the breasts is recommended if a risk assessment
calculation for breast cancer, preferably using the Tyrer-Cuzick
model, measures greater than 20%.

I have discussed the findings and recommendations with the patient.
If applicable, a reminder letter will be sent to the patient
regarding the next appointment.

BI-RADS CATEGORY  1: Negative.

## 2021-04-23 ENCOUNTER — Other Ambulatory Visit: Payer: Self-pay

## 2021-04-23 ENCOUNTER — Encounter: Payer: Self-pay | Admitting: Family Medicine

## 2021-04-23 ENCOUNTER — Ambulatory Visit (INDEPENDENT_AMBULATORY_CARE_PROVIDER_SITE_OTHER): Payer: 59 | Admitting: Family Medicine

## 2021-04-23 VITALS — BP 108/64 | HR 73 | Temp 97.8°F | Ht 64.0 in | Wt 178.0 lb

## 2021-04-23 DIAGNOSIS — U099 Post covid-19 condition, unspecified: Secondary | ICD-10-CM | POA: Diagnosis not present

## 2021-04-23 DIAGNOSIS — R41840 Attention and concentration deficit: Secondary | ICD-10-CM | POA: Insufficient documentation

## 2021-04-23 DIAGNOSIS — Z0000 Encounter for general adult medical examination without abnormal findings: Secondary | ICD-10-CM

## 2021-04-23 DIAGNOSIS — Z122 Encounter for screening for malignant neoplasm of respiratory organs: Secondary | ICD-10-CM

## 2021-04-23 LAB — COMPREHENSIVE METABOLIC PANEL
ALT: 10 U/L (ref 0–35)
AST: 10 U/L (ref 0–37)
Albumin: 4.4 g/dL (ref 3.5–5.2)
Alkaline Phosphatase: 71 U/L (ref 39–117)
BUN: 17 mg/dL (ref 6–23)
CO2: 27 mEq/L (ref 19–32)
Calcium: 9.8 mg/dL (ref 8.4–10.5)
Chloride: 108 mEq/L (ref 96–112)
Creatinine, Ser: 0.87 mg/dL (ref 0.40–1.20)
GFR: 76.61 mL/min (ref >60.00)
Glucose, Bld: 76 mg/dL (ref 70–99)
Potassium: 4.1 mEq/L (ref 3.5–5.1)
Sodium: 142 mEq/L (ref 135–145)
Total Bilirubin: 0.4 mg/dL (ref 0.2–1.2)
Total Protein: 6.6 g/dL (ref 6.0–8.3)

## 2021-04-23 LAB — LIPID PANEL
Cholesterol: 209 mg/dL — ABNORMAL HIGH (ref 0–200)
HDL: 52.8 mg/dL (ref >39.00)
NonHDL: 155.7
Total CHOL/HDL Ratio: 4
Triglycerides: 235 mg/dL — ABNORMAL HIGH (ref 0.0–149.0)
VLDL: 47 mg/dL — ABNORMAL HIGH (ref 0.0–40.0)

## 2021-04-23 LAB — CBC
HCT: 42.3 % (ref 36.0–46.0)
Hemoglobin: 14 g/dL (ref 12.0–15.0)
MCHC: 33 g/dL (ref 30.0–36.0)
MCV: 94.8 fl (ref 78.0–100.0)
Platelets: 225 10*3/uL (ref 150.0–400.0)
RBC: 4.46 Mil/uL (ref 3.87–5.11)
RDW: 14.3 % (ref 11.5–15.5)
WBC: 7.1 10*3/uL (ref 4.0–10.5)

## 2021-04-23 LAB — LDL CHOLESTEROL, DIRECT: Direct LDL: 129 mg/dL

## 2021-04-23 NOTE — Patient Instructions (Addendum)
Give [place] 2-3 business days to get the results of your labs back.   Keep the diet clean and stay active.  I recommend getting the updated covid vaccine booster at your convenience.   Aim to do some physical exertion for 150 minutes per week. This is typically divided into 5 days per week, 30 minutes per day. The activity should be enough to get your heart rate up. Anything is better than nothing if you have time constraints.  Someone will reach out regarding your lung scan. Reach out if you don't hear anything in the next 2 weeks.  Please schedule your appointment for cervical cancer screening:  Call Center for [facility] at [facility] at [phone] for an appointment.  They are located at [address], [address], [place], [place], [address] (right across the hall from our office).  OK to take Tylenol 1000 mg (2 extra strength tabs) or 975 mg (3 regular strength tabs) every 6 hours as needed.  Ibuprofen 400-600 mg (2-3 over the counter strength tabs) every 6 hours as needed for pain.  Supplements to help focus: Using fish oil/omega 3 that is 1000 mg (or roughly 600 mg EPA/DHA), starting as soon as possible after concussion, take: 3 tabs THREE TIMES a day  for the first 3 days, then (you will smell a little, sorry) 3 tabs TWICE DAILY  for the next 3 days, then 3 tabs ONCE DAILY  for the next 10 days  Foods that may reduce pain: 1) Ginger 2) Blueberries 3) Salmon 4) Pumpkin seeds 5) dark chocolate 6) turmeric 7) tart cherries 8) virgin olive oil 9) chilli peppers 10) mint 11) red wine  Let us know if you need anything.

## 2021-04-23 NOTE — Progress Notes (Signed)
Chief Complaint  Patient presents with   Annual Exam     Well Woman Hannah Keith is here for a complete physical.   Her last physical was >1 year ago.  Current diet: in general, diet is poor. Current exercise: none. Weight is up a little and she confirms fatigue since covid. Seatbelt? Yes  Health Maintenance Pap/HPV- Due Mammogram- Yes Colon cancer screening-Yes Shingrix- Yes Tetanus- Yes Hep C screening- Yes HIV screening- Yes Lung cancer screening- No  Past Medical History:  Diagnosis Date   Anxiety    Depression    History of chicken pox    Panic attacks    PTSD (post-traumatic stress disorder)    Vaginal Pap smear, abnormal      Past Surgical History:  Procedure Laterality Date   LEEP  2001-2002    Medications  Current Outpatient Medications on File Prior to Visit  Medication Sig Dispense Refill   acetaminophen (TYLENOL) 325 MG tablet Take 650 mg by mouth every 6 (six) hours as needed.     albuterol (VENTOLIN HFA) 108 (90 Base) MCG/ACT inhaler Inhale 2 puffs into the lungs every 6 (six) hours as needed for wheezing or shortness of breath. 18 g 0   amphetamine-dextroamphetamine (ADDERALL) 20 MG tablet Take 20 mg by mouth 3 (three) times daily.     escitalopram (LEXAPRO) 10 MG tablet Take 10 mg by mouth daily.     MAGNESIUM PO Take by mouth.     Multiple Vitamins-Minerals (EMERGEN-C IMMUNE PLUS) PACK Take 1 tablet by mouth 2 (two) times daily. 10 each 0   Multiple Vitamins-Minerals (ONE-A-DAY WOMENS PO) Take by mouth.     Potassium (POTASSIMIN PO) Take by mouth.     traZODone (DESYREL) 50 MG tablet Take 50 mg by mouth at bedtime as needed for sleep.     Allergies Allergies  Allergen Reactions   Nsaids     Cause GI Issues in large amounts   Penicillins Rash    Review of Systems: Constitutional:  no unexpected weight changes Eye:  no recent significant change in vision Ear/Nose/Mouth/Throat:  Ears:  no recent change in  hearing Nose/Mouth/Throat:  no complaints of nasal congestion, no sore throat Cardiovascular: no chest pain Respiratory:  no shortness of breath Gastrointestinal:  no abdominal pain, no change in bowel habits GU:  Female: negative for dysuria or pelvic pain Musculoskeletal/Extremities: +intermittent jt pain after covid Integumentary (Skin/Breast):  no abnormal skin lesions reported Neurologic:  no headaches Endocrine:  + fatigue  Exam BP 108/64   Pulse 73   Temp 97.8 F (36.6 C) (Oral)   Ht 5\' 4"  (1.626 m)   Wt 178 lb (80.7 kg)   SpO2 99%   BMI 30.55 kg/m  General:  well developed, well nourished, in no apparent distress Skin:  no significant moles, warts, or growths Head:  no masses, lesions, or tenderness Eyes:  pupils equal and round, sclera anicteric without injection Ears:  canals without lesions, TMs shiny without retraction, no obvious effusion, no erythema Nose:  nares patent, septum midline, mucosa normal, and no drainage or sinus tenderness Throat/Pharynx:  lips and gingiva without lesion; tongue and uvula midline; non-inflamed pharynx; no exudates or postnasal drainage Neck: neck supple without adenopathy, thyromegaly, or masses Lungs:  clear to auscultation, breath sounds equal bilaterally, no respiratory distress Cardio:  regular rate and rhythm, no LE edema Abdomen:  abdomen soft, nontender; bowel sounds normal; no masses or organomegaly Genital: Defer to GYN Musculoskeletal:  symmetrical muscle groups noted  without atrophy or deformity Extremities:  no clubbing, cyanosis, or edema, no deformities, no skin discoloration Neuro:  gait normal; deep tendon reflexes normal and symmetric Psych: well oriented with normal range of affect and appropriate judgment/insight  Assessment and Plan  Well adult exam - Plan: CBC, Comprehensive metabolic panel, Lipid panel  Screening for lung cancer - Plan: CT CHEST LUNG CANCER SCREENING LOW DOSE WO CONTRAST  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 female. Counseled on diet and exercise. Other orders as above. Covid: discussed supplements to help with concentration and mental fog and pains.  Follow up in 1 yr or prn. The patient voiced understanding and agreement to the plan.  [place], DO 04/23/21 9:38 AM

## 2021-04-24 ENCOUNTER — Other Ambulatory Visit: Payer: Self-pay | Admitting: Family Medicine

## 2021-04-24 DIAGNOSIS — E785 Hyperlipidemia, unspecified: Secondary | ICD-10-CM

## 2021-04-30 ENCOUNTER — Other Ambulatory Visit: Payer: Self-pay

## 2021-04-30 ENCOUNTER — Ambulatory Visit (HOSPITAL_BASED_OUTPATIENT_CLINIC_OR_DEPARTMENT_OTHER)
Admission: RE | Admit: 2021-04-30 | Discharge: 2021-04-30 | Disposition: A | Discharge status: Home / Self Care | Payer: 59 | Source: Ambulatory Visit | Attending: Family Medicine | Admitting: Family Medicine

## 2021-04-30 DIAGNOSIS — Z122 Encounter for screening for malignant neoplasm of respiratory organs: Secondary | ICD-10-CM | POA: Insufficient documentation

## 2021-05-02 ENCOUNTER — Encounter: Payer: Self-pay | Admitting: Family Medicine

## 2021-05-02 ENCOUNTER — Other Ambulatory Visit (HOSPITAL_BASED_OUTPATIENT_CLINIC_OR_DEPARTMENT_OTHER): Payer: Self-pay | Admitting: Family Medicine

## 2021-05-02 DIAGNOSIS — J439 Emphysema, unspecified: Secondary | ICD-10-CM | POA: Insufficient documentation

## 2021-05-02 DIAGNOSIS — Z1231 Encounter for screening mammogram for malignant neoplasm of breast: Secondary | ICD-10-CM

## 2021-05-08 ENCOUNTER — Encounter (HOSPITAL_BASED_OUTPATIENT_CLINIC_OR_DEPARTMENT_OTHER): Payer: Self-pay

## 2021-05-08 ENCOUNTER — Other Ambulatory Visit: Payer: Self-pay

## 2021-05-08 ENCOUNTER — Ambulatory Visit (HOSPITAL_BASED_OUTPATIENT_CLINIC_OR_DEPARTMENT_OTHER)
Admission: RE | Admit: 2021-05-08 | Discharge: 2021-05-08 | Disposition: A | Discharge status: Home / Self Care | Payer: 59 | Source: Ambulatory Visit | Attending: Family Medicine | Admitting: Family Medicine

## 2021-05-08 DIAGNOSIS — Z1231 Encounter for screening mammogram for malignant neoplasm of breast: Secondary | ICD-10-CM | POA: Diagnosis present

## 2021-06-05 ENCOUNTER — Other Ambulatory Visit: Payer: Self-pay

## 2021-06-05 ENCOUNTER — Other Ambulatory Visit (INDEPENDENT_AMBULATORY_CARE_PROVIDER_SITE_OTHER): Payer: 59

## 2021-06-05 DIAGNOSIS — E785 Hyperlipidemia, unspecified: Secondary | ICD-10-CM

## 2021-06-05 LAB — LIPID PANEL
Cholesterol: 225 mg/dL — ABNORMAL HIGH (ref 0–200)
HDL: 68.5 mg/dL (ref >39.00)
LDL Cholesterol: 135 mg/dL — ABNORMAL HIGH (ref 0–99)
NonHDL: 156.13
Total CHOL/HDL Ratio: 3
Triglycerides: 107 mg/dL (ref 0.0–149.0)
VLDL: 21.4 mg/dL (ref 0.0–40.0)

## 2021-07-05 ENCOUNTER — Other Ambulatory Visit: Payer: Self-pay

## 2021-07-05 ENCOUNTER — Encounter: Payer: Self-pay | Admitting: Family Medicine

## 2021-07-05 ENCOUNTER — Other Ambulatory Visit (HOSPITAL_COMMUNITY)
Admission: RE | Admit: 2021-07-05 | Discharge: 2021-07-05 | Disposition: A | Discharge status: Home / Self Care | Payer: 59 | Source: Ambulatory Visit | Attending: Family Medicine | Admitting: Family Medicine

## 2021-07-05 ENCOUNTER — Ambulatory Visit (INDEPENDENT_AMBULATORY_CARE_PROVIDER_SITE_OTHER): Payer: 59 | Admitting: Family Medicine

## 2021-07-05 VITALS — BP 100/76 | HR 69 | Ht 64.0 in | Wt 184.0 lb

## 2021-07-05 DIAGNOSIS — Z01419 Encounter for gynecological examination (general) (routine) without abnormal findings: Secondary | ICD-10-CM | POA: Diagnosis not present

## 2021-07-05 DIAGNOSIS — Z803 Family history of malignant neoplasm of breast: Secondary | ICD-10-CM | POA: Diagnosis not present

## 2021-07-05 NOTE — Patient Instructions (Signed)
https://www.[facility].org/health/wellness-and-prevention/5-truths-you-need-to-know-about-vaping#:~:text=1%3A%20Vaping%20is%20less%20harmful,many%20of%20which%20are%20toxic.

## 2021-07-05 NOTE — Progress Notes (Signed)
GYN/ Annual Last Pap 03/2016, hx LEEP Mammogram 05/2021 Postmenopausal: continues to report hot flashes.  Multiple relatives both sides of family with breast cancer. Wants to discuss genetic screening. PCP deferred to GYN.  Talking about switching from cigarettes to vaping. States will use it less.

## 2021-07-05 NOTE — Progress Notes (Signed)
GYNECOLOGY ANNUAL PREVENTATIVE CARE ENCOUNTER NOTE  Subjective:   Hannah Keith is a 50 y.o. G59P2000 female here for a routine annual gynecologic exam.  Current complaints: none.   Denies abnormal vaginal bleeding, discharge, pelvic pain, problems with intercourse or other gynecologic concerns.    Gynecologic History No LMP recorded. Patient is postmenopausal. Patient is not sexually active  Contraception: post menopausal status Last Pap: 2017. Results were: normal Last mammogram: 05/2021. Results were: normal Colorectal Cancer Screening: up to date   The pregnancy intention screening data noted above was reviewed. Potential methods of contraception were discussed. The patient elected to proceed with No data recorded.   Obstetric History OB History  Gravida Para Term Preterm AB Living  2 2 2         SAB IAB Ectopic Multiple Live Births          2    # Outcome Date GA Lbr Len/2nd Weight Sex Delivery Anes PTL Lv  2 Term      Vag-Spont     1 Term      Vag-Spont       Past Medical History:  Diagnosis Date   Anxiety    Depression    Emphysema lung (HCC)    History of chicken pox    Panic attacks    PTSD (post-traumatic stress disorder)    Vaginal Pap smear, abnormal     Past Surgical History:  Procedure Laterality Date   LEEP  2001-2002    Current Outpatient Medications on File Prior to Visit  Medication Sig Dispense Refill   acetaminophen (TYLENOL) 325 MG tablet Take 650 mg by mouth every 6 (six) hours as needed.     albuterol (VENTOLIN HFA) 108 (90 Base) MCG/ACT inhaler Inhale 2 puffs into the lungs every 6 (six) hours as needed for wheezing or shortness of breath. 18 g 0   amphetamine-dextroamphetamine (ADDERALL) 20 MG tablet Take 20 mg by mouth 3 (three) times daily.     escitalopram (LEXAPRO) 10 MG tablet Take 10 mg by mouth daily.     MAGNESIUM PO Take by mouth.     Multiple Vitamins-Minerals (EMERGEN-C IMMUNE PLUS) PACK Take 1 tablet by mouth 2 (two) times  daily. 10 each 0   Multiple Vitamins-Minerals (ONE-A-DAY WOMENS PO) Take by mouth.     Potassium (POTASSIMIN PO) Take by mouth.     traZODone (DESYREL) 50 MG tablet Take 50 mg by mouth at bedtime as needed for sleep.     No current facility-administered medications on file prior to visit.    Allergies  Allergen Reactions   Nsaids     Cause GI Issues in large amounts   Penicillins Rash    Social History   Socioeconomic History   Marital status: Married    Spouse name: Not on file   Number of children: Not on file   Years of education: Not on file   Highest education level: Not on file  Occupational History   Not on file  Tobacco Use   Smoking status: Every Day    Packs/day: 0.50    Years: 30.00    Pack years: 15.00    Types: Cigarettes   Smokeless tobacco: Never  Vaping Use   Vaping Use: Never used  Substance and Sexual Activity   Alcohol use: Not Currently    Comment: social drinker -- 1-2 beers if out with friends   Drug use: Never   Sexual activity: Yes  Other Topics Concern  Not on file  Social History Narrative   Not on file   Social Determinants of Health   Financial Resource Strain: Not on file  Food Insecurity: Not on file  Transportation Needs: Not on file  Physical Activity: Not on file  Stress: Not on file  Social Connections: Not on file  Intimate Partner Violence: Not on file    Family History  Problem Relation Age of Onset   Breast cancer Paternal Grandmother        PGGM-Breast Cancer   Diabetes Maternal Grandmother    Alcoholism Maternal Grandfather    Heart attack Father        Living   Heart disease Father    Hyperlipidemia Father    Hypertension Father    Arthritis/Rheumatoid Father    Depression Mother        Living   Diabetes Mother    Hyperlipidemia Mother    Hypertension Mother    Thyroid disease Sister    Depression Son    Ulcerative colitis Son        Age 57   Anxiety disorder Son    Depression Son        Age 62    Heart attack Maternal Uncle    Stroke Maternal Uncle    Heart disease Maternal Uncle    Heart attack Maternal Uncle    Breast cancer Paternal Aunt    Breast cancer Other    Healthy Neg Hx        Age 18   Colon cancer Neg Hx    Colon polyps Neg Hx    Esophageal cancer Neg Hx    Rectal cancer Neg Hx    Stomach cancer Neg Hx     The following portions of the patient's history were reviewed and updated as appropriate: allergies, current medications, past family history, past medical history, past social history, past surgical history and problem list.  Review of Systems Pertinent items are noted in HPI.   Objective:  BP 100/76    Pulse 69    Ht 5\' 4"  (1.626 m)    Wt 184 lb (83.5 kg)    BMI 31.58 kg/m  Wt Readings from Last 3 Encounters:  07/05/21 184 lb (83.5 kg)  04/23/21 178 lb (80.7 kg)  07/17/20 165 lb 3.2 oz (74.9 kg)     Chaperone present during exam  CONSTITUTIONAL: Well-developed, well-nourished female in no acute distress.  HENT:  Normocephalic, atraumatic, External right and left ear normal. Oropharynx is clear and moist EYES: Conjunctivae and EOM are normal. Pupils are equal, round, and reactive to light. No scleral icterus.  NECK: Normal range of motion, supple, no masses.  Normal thyroid.   CARDIOVASCULAR: Normal heart rate noted, regular rhythm RESPIRATORY: Clear to auscultation bilaterally. Effort and breath sounds normal, no problems with respiration noted. BREASTS: Symmetric in size. No masses, skin changes, nipple drainage, or lymphadenopathy. ABDOMEN: Soft, normal bowel sounds, no distention noted.  No tenderness, rebound or guarding.  PELVIC: Normal appearing external genitalia; normal appearing vaginal mucosa and cervix.  No abnormal discharge noted.  Normal uterine size, no other palpable masses, no uterine or adnexal tenderness. MUSCULOSKELETAL: Normal range of motion. No tenderness.  No cyanosis, clubbing, or edema.  2+ distal pulses. SKIN: Skin is warm  and dry. No rash noted. Not diaphoretic. No erythema. No pallor. NEUROLOGIC: Alert and oriented to person, place, and time. Normal reflexes, muscle tone coordination. No cranial nerve deficit noted. PSYCHIATRIC: Normal mood and affect. Normal behavior.  Normal judgment and thought content.  Assessment:  Annual gynecologic examination with pap smear   Plan:  1. Well Woman Exam Will follow up results of pap smear and manage accordingly. Mammogram scheduled  2. Family history of breast cancer Genetic testing ordered.  [place], [place] for [employer]

## 2021-07-11 LAB — CYTOLOGY - PAP
Comment: NEGATIVE
Diagnosis: NEGATIVE
High risk HPV: NEGATIVE

## 2021-07-13 ENCOUNTER — Other Ambulatory Visit: Payer: 59

## 2021-07-19 ENCOUNTER — Other Ambulatory Visit: Payer: Self-pay

## 2021-07-19 ENCOUNTER — Other Ambulatory Visit (INDEPENDENT_AMBULATORY_CARE_PROVIDER_SITE_OTHER): Payer: 59

## 2021-07-19 ENCOUNTER — Encounter: Payer: Self-pay | Admitting: General Practice

## 2021-07-19 DIAGNOSIS — Z129 Encounter for screening for malignant neoplasm, site unspecified: Secondary | ICD-10-CM

## 2021-07-19 NOTE — Progress Notes (Signed)
Pt presents for [facility] Hereditary cancer test. Pt 's blood was drawn and sent to [facility].  Alizay Bronkema l Cherylene Ferrufino, CMA

## 2021-07-19 NOTE — Progress Notes (Signed)
Patient was assessed and managed by nursing staff during this encounter. I have reviewed the chart and agree with the documentation and plan. I have also made any necessary editorial changes.  Mora Bellman, MD 07/19/2021 11:56 AM

## 2021-08-06 DIAGNOSIS — I82431 Acute embolism and thrombosis of right popliteal vein: Secondary | ICD-10-CM | POA: Insufficient documentation

## 2022-02-18 ENCOUNTER — Other Ambulatory Visit: Payer: Self-pay

## 2022-02-18 ENCOUNTER — Emergency Department (HOSPITAL_BASED_OUTPATIENT_CLINIC_OR_DEPARTMENT_OTHER): Payer: 59

## 2022-02-18 ENCOUNTER — Encounter (HOSPITAL_BASED_OUTPATIENT_CLINIC_OR_DEPARTMENT_OTHER): Payer: Self-pay | Admitting: Emergency Medicine

## 2022-02-18 ENCOUNTER — Emergency Department (HOSPITAL_BASED_OUTPATIENT_CLINIC_OR_DEPARTMENT_OTHER)
Admission: EM | Admit: 2022-02-18 | Discharge: 2022-02-18 | Disposition: A | Discharge status: Home / Self Care | Payer: 59 | Attending: Emergency Medicine | Admitting: Emergency Medicine

## 2022-02-18 DIAGNOSIS — F17210 Nicotine dependence, cigarettes, uncomplicated: Secondary | ICD-10-CM | POA: Insufficient documentation

## 2022-02-18 DIAGNOSIS — R0789 Other chest pain: Secondary | ICD-10-CM | POA: Insufficient documentation

## 2022-02-18 DIAGNOSIS — R079 Chest pain, unspecified: Secondary | ICD-10-CM | POA: Diagnosis present

## 2022-02-18 LAB — CBC
HCT: 40.8 % (ref 36.0–46.0)
Hemoglobin: 13.8 g/dL (ref 12.0–15.0)
MCH: 31 pg (ref 26.0–34.0)
MCHC: 33.8 g/dL (ref 30.0–36.0)
MCV: 91.7 fL (ref 80.0–100.0)
Platelets: 268 10*3/uL (ref 150–400)
RBC: 4.45 MIL/uL (ref 3.87–5.11)
RDW: 13.6 % (ref 11.5–15.5)
WBC: 7.7 10*3/uL (ref 4.0–10.5)
nRBC: 0 % (ref 0.0–0.2)

## 2022-02-18 LAB — BASIC METABOLIC PANEL WITH GFR
Anion gap: 5 (ref 5–15)
BUN: 19 mg/dL (ref 6–20)
CO2: 26 mmol/L (ref 22–32)
Calcium: 8.8 mg/dL — ABNORMAL LOW (ref 8.9–10.3)
Chloride: 107 mmol/L (ref 98–111)
Creatinine, Ser: 1.04 mg/dL — ABNORMAL HIGH (ref 0.44–1.00)
GFR, Estimated: >60 mL/min
Glucose, Bld: 108 mg/dL — ABNORMAL HIGH (ref 70–99)
Potassium: 3.4 mmol/L — ABNORMAL LOW (ref 3.5–5.1)
Sodium: 138 mmol/L (ref 135–145)

## 2022-02-18 LAB — PREGNANCY, URINE: Preg Test, Ur: NEGATIVE

## 2022-02-18 LAB — TROPONIN I (HIGH SENSITIVITY)
Troponin I (High Sensitivity): <2 ng/L
Troponin I (High Sensitivity): <2 ng/L (ref <18)

## 2022-02-18 MED ORDER — IOHEXOL 350 MG/ML SOLN
100.0000 mL | Freq: Once | INTRAVENOUS | Status: AC | PRN
  Administered 2022-02-18: 100 mL via INTRAVENOUS

## 2022-02-18 MED ORDER — NAPROXEN 375 MG PO TABS: 375.0000 mg | ORAL_TABLET | Freq: Two times a day (BID) | ORAL | 0 refills | Status: AC | PRN

## 2022-02-18 NOTE — ED Triage Notes (Signed)
Chest pain times few weeks, worse today, radiates to right shoulder and elbow. Has Hx of DVT.

## 2022-02-18 NOTE — ED Provider Notes (Signed)
[facility] EMERGENCY DEPARTMENT  Provider Note  CSN: [id number] Arrival date & time: 02/18/22 0143  History Chief Complaint  Patient presents with   Chest Pain    Hannah Keith is a 51 y.o. female with history of DVT in RLE earlier this year but no PE has been off anticoagulation for th last month. She reports 2 weeks of intermittent anterior chest pain R>L, radiating through to shoulder and down her R arm.  Not associated with SOB. She has had a cough related to smoking but no fever. She denies any new leg pain or swelling.    Home Medications Prior to Admission medications   Medication Sig Start Date End Date Taking? Authorizing Provider  naproxen (NAPROSYN) 375 MG tablet Take 1 tablet (375 mg total) by mouth 2 (two) times daily as needed for up to 5 days. 02/18/22 02/23/22 Yes Truddie Hidden, MD  acetaminophen (TYLENOL) 325 MG tablet Take 650 mg by mouth every 6 (six) hours as needed.    [provider]  albuterol (VENTOLIN HFA) 108 (90 Base) MCG/ACT inhaler Inhale 2 puffs into the lungs every 6 (six) hours as needed for wheezing or shortness of breath. 07/19/20   Tysinger, Camelia Eng, PA-C  amphetamine-dextroamphetamine (ADDERALL) 20 MG tablet Take 20 mg by mouth 3 (three) times daily.    [provider]  desvenlafaxine (PRISTIQ) 50 MG 24 hr tablet Take 50 mg by mouth every morning. 09/20/21   [provider]  MAGNESIUM PO Take by mouth.    [provider]  Multiple Vitamins-Minerals (EMERGEN-C IMMUNE PLUS) PACK Take 1 tablet by mouth 2 (two) times daily. 07/19/20   Tysinger, Camelia Eng, PA-C  Multiple Vitamins-Minerals (ONE-A-DAY WOMENS PO) Take by mouth.    [provider]  Potassium (POTASSIMIN PO) Take by mouth.    [provider]  traZODone (DESYREL) 50 MG tablet Take 50 mg by mouth at bedtime as needed for sleep.    [provider]     Allergies    Nsaids and Penicillins   Review of Systems   Review of  Systems Please see HPI for pertinent positives and negatives  Physical Exam BP (!) 106/59   Pulse 79   Temp 98 F (36.7 C) (Oral)   Resp 16   Ht '5\' 4"'$  (1.626 m)   Wt 83.9 kg   SpO2 98%   BMI 31.76 kg/m   Physical Exam Vitals and nursing note reviewed.  Constitutional:      Appearance: Normal appearance.  HENT:     Head: Normocephalic and atraumatic.     Nose: Nose normal.     Mouth/Throat:     Mouth: Mucous membranes are moist.  Eyes:     Extraocular Movements: Extraocular movements intact.     Conjunctiva/sclera: Conjunctivae normal.  Cardiovascular:     Rate and Rhythm: Normal rate.  Pulmonary:     Effort: Pulmonary effort is normal.     Breath sounds: Normal breath sounds.  Chest:     Chest wall: Tenderness (R upper, reproduces pain) present.  Abdominal:     General: Abdomen is flat.     Palpations: Abdomen is soft.     Tenderness: There is no abdominal tenderness.  Musculoskeletal:        General: No swelling or tenderness. Normal range of motion.     Cervical back: Neck supple.     Right lower leg: No edema.     Left lower leg: No edema.  Skin:  General: Skin is warm and dry.  Neurological:     General: No focal deficit present.     Mental Status: She is alert.  Psychiatric:        Mood and Affect: Mood normal.     ED Results / Procedures / Treatments   EKG EKG Interpretation  Date/Time:  Monday February 18 2022 01:52:16 EDT Ventricular Rate:  69 PR Interval:  136 QRS Duration: 105 QT Interval:  430 QTC Calculation: 461 R Axis:   74 Text Interpretation: Sinus rhythm Low voltage, precordial leads No old tracing to compare Confirmed by Calvert Cantor [phone]) on 02/18/2022 2:23:06 AM  Procedures Procedures  Medications Ordered in the ED Medications  iohexol (OMNIPAQUE) 350 MG/ML injection 100 mL (100 mLs Intravenous Contrast Given 02/18/22 0244)    Initial Impression and Plan  Patient here with reproducible pleuritic right sided chest  pains, has had recent DVT but cleared on follow up US and no longer on AC. She has no history of CAD. Will check labs, send for CTA and reassess.   ED Course   Clinical Course as of 02/18/22 0501  Mon Feb 18, 2022  0236 CBC is normal, BMP is unremarkable.  [CS]  3729 HCG is neg. Trop #1 is normal.  [CS]  [id number] I personally viewed the images from radiology studies and agree with radiologist interpretation: CXR is clear.   [CS]  0330 I personally viewed the images from radiology studies and agree with radiologist interpretation: CTA is neg for PE or other acute process.   [CS]  0211 Repeat Trop remains normal. Low risk for ACS. Likely MSK pain given reproducibility. Recommend NSAIDs, rest and PCP follow up.  [CS]    Clinical Course User Index [CS] Truddie Hidden, MD     MDM Rules/Calculators/A&P Medical Decision Making Given presenting complaint, I considered that admission might be necessary. After review of results from ED lab and/or imaging studies, admission to the hospital is not indicated at this time.    Problems Addressed: Chest wall pain: acute illness or injury  Amount and/or Complexity of Data Reviewed Labs: ordered. Decision-making details documented in ED Course. Radiology: ordered and independent interpretation performed. Decision-making details documented in ED Course. ECG/medicine tests: ordered and independent interpretation performed. Decision-making details documented in ED Course.  Risk Prescription drug management. Decision regarding hospitalization.    Final Clinical Impression(s) / ED Diagnoses Final diagnoses:  Chest wall pain    Rx / DC Orders ED Discharge Orders          Ordered    naproxen (NAPROSYN) 375 MG tablet  2 times daily PRN        02/18/22 0501             Truddie Hidden, MD 02/18/22 0501

## 2022-04-08 ENCOUNTER — Other Ambulatory Visit: Payer: Self-pay | Admitting: Family Medicine

## 2022-04-08 DIAGNOSIS — Z122 Encounter for screening for malignant neoplasm of respiratory organs: Secondary | ICD-10-CM

## 2022-04-23 ENCOUNTER — Ambulatory Visit (HOSPITAL_BASED_OUTPATIENT_CLINIC_OR_DEPARTMENT_OTHER)
Admission: RE | Admit: 2022-04-23 | Discharge: 2022-04-23 | Disposition: A | Discharge status: Home / Self Care | Payer: 59 | Source: Ambulatory Visit | Attending: Family Medicine | Admitting: Family Medicine

## 2022-04-23 DIAGNOSIS — Z122 Encounter for screening for malignant neoplasm of respiratory organs: Secondary | ICD-10-CM | POA: Insufficient documentation

## 2022-04-24 ENCOUNTER — Ambulatory Visit (INDEPENDENT_AMBULATORY_CARE_PROVIDER_SITE_OTHER): Payer: 59 | Admitting: Family Medicine

## 2022-04-24 ENCOUNTER — Encounter: Payer: Self-pay | Admitting: Family Medicine

## 2022-04-24 VITALS — BP 120/82 | HR 81 | Temp 98.3°F | Ht 64.0 in | Wt 194.4 lb

## 2022-04-24 DIAGNOSIS — Z0000 Encounter for general adult medical examination without abnormal findings: Secondary | ICD-10-CM

## 2022-04-24 DIAGNOSIS — R058 Other specified cough: Secondary | ICD-10-CM

## 2022-04-24 DIAGNOSIS — Z23 Encounter for immunization: Secondary | ICD-10-CM

## 2022-04-24 LAB — COMPREHENSIVE METABOLIC PANEL
ALT: 12 U/L (ref 0–35)
AST: 11 U/L (ref 0–37)
Albumin: 4.3 g/dL (ref 3.5–5.2)
Alkaline Phosphatase: 75 U/L (ref 39–117)
BUN: 20 mg/dL (ref 6–23)
CO2: 26 mEq/L (ref 19–32)
Calcium: 9.8 mg/dL (ref 8.4–10.5)
Chloride: 107 mEq/L (ref 96–112)
Creatinine, Ser: 0.94 mg/dL (ref 0.40–1.20)
GFR: 69.32 mL/min (ref >60.00)
Glucose, Bld: 93 mg/dL (ref 70–99)
Potassium: 4.5 mEq/L (ref 3.5–5.1)
Sodium: 139 mEq/L (ref 135–145)
Total Bilirubin: 0.3 mg/dL (ref 0.2–1.2)
Total Protein: 6.9 g/dL (ref 6.0–8.3)

## 2022-04-24 LAB — CBC
HCT: 42.6 % (ref 36.0–46.0)
Hemoglobin: 14.1 g/dL (ref 12.0–15.0)
MCHC: 33.2 g/dL (ref 30.0–36.0)
MCV: 93 fl (ref 78.0–100.0)
Platelets: 288 10*3/uL (ref 150.0–400.0)
RBC: 4.58 Mil/uL (ref 3.87–5.11)
RDW: 14.2 % (ref 11.5–15.5)
WBC: 6.2 10*3/uL (ref 4.0–10.5)

## 2022-04-24 LAB — LIPID PANEL
Cholesterol: 195 mg/dL (ref 0–200)
HDL: 61.4 mg/dL (ref >39.00)
LDL Cholesterol: 121 mg/dL — ABNORMAL HIGH (ref 0–99)
NonHDL: 133.55
Total CHOL/HDL Ratio: 3
Triglycerides: 62 mg/dL (ref 0.0–149.0)
VLDL: 12.4 mg/dL (ref 0.0–40.0)

## 2022-04-24 MED ORDER — FLOVENT DISKUS 100 MCG/ACT IN AEPB: 2.0000 | INHALATION_SPRAY | Freq: Two times a day (BID) | RESPIRATORY_TRACT | 1 refills | Status: DC

## 2022-04-24 NOTE — Progress Notes (Signed)
Chief Complaint  Patient presents with   Annual Exam     Well Woman Hannah Keith is here for a complete physical.   Her last physical was >1 year ago.  Current diet: in general, diet is fair. Current exercise: limited. Weight is up a few lbs and she denies fatigue out of ordinary. Seatbelt? Yes Advanced directive? No  Health Maintenance Pap/HPV- Yes Mammogram- Yes Colon cancer screening-Yes Shingrix- Yes Tetanus- Yes Hep C screening- Yes Lung cancer screening- Yes HIV screening- Yes  Cough Stuffy nose, chest tightness, and coughing for past mo since having covid. She is not having any ST or fevers. Slight improvement but not significant. No current sick contacts. She is a smoker.  She has not tried anything at home so far.  Past Medical History:  Diagnosis Date   Anxiety    Depression    Emphysema lung (HCC)    History of chicken pox    Panic attacks    PTSD (post-traumatic stress disorder)    Vaginal Pap smear, abnormal      Past Surgical History:  Procedure Laterality Date   LEEP  2001-2002    Medications  Current Outpatient Medications on File Prior to Visit  Medication Sig Dispense Refill   acetaminophen (TYLENOL) 325 MG tablet Take 650 mg by mouth every 6 (six) hours as needed.     albuterol (VENTOLIN HFA) 108 (90 Base) MCG/ACT inhaler Inhale 2 puffs into the lungs every 6 (six) hours as needed for wheezing or shortness of breath. 18 g 0   amphetamine-dextroamphetamine (ADDERALL) 20 MG tablet Take 20 mg by mouth 3 (three) times daily.     traZODone (DESYREL) 50 MG tablet Take 50 mg by mouth at bedtime as needed for sleep.     Vilazodone HCl (VIIBRYD) 40 MG TABS Take 1 tablet by mouth at bedtime.     Allergies Allergies  Allergen Reactions   Nsaids     Cause GI Issues in large amounts   Penicillins Rash    Review of Systems: Constitutional:  no unexpected weight changes Eye:  no recent significant change in vision Ear/Nose/Mouth/Throat:  Ears:  no  recent change in hearing Nose/Mouth/Throat:  no complaints of nasal congestion, no sore throat Cardiovascular: no chest pain Respiratory:  + Cough Gastrointestinal:  no abdominal pain, no change in bowel habits GU:  Female: negative for dysuria or pelvic pain Musculoskeletal/Extremities:  no pain of the joints Integumentary (Skin/Breast):  no abnormal skin lesions reported Neurologic:  no headaches Endocrine:  denies fatigue  Exam BP 120/82 (BP Location: Left Arm, Patient Position: Sitting, Cuff Size: Normal)   Pulse 81   Temp 98.3 F (36.8 C) (Oral)   Ht '5\' 4"'$  (1.626 m)   Wt 194 lb 6 oz (88.2 kg)   SpO2 95%   BMI 33.36 kg/m  General:  well developed, well nourished, in no apparent distress Skin:  no significant moles, warts, or growths Head:  no masses, lesions, or tenderness Eyes:  pupils equal and round, sclera anicteric without injection Ears:  canals without lesions, TMs shiny without retraction, no obvious effusion, no erythema Nose:  nares patent, mucosa normal, and no drainage  Throat/Pharynx:  lips and gingiva without lesion; tongue and uvula midline; non-inflamed pharynx; no exudates or postnasal drainage Neck: neck supple without adenopathy, thyromegaly, or masses Lungs:  clear to auscultation, breath sounds equal bilaterally, no respiratory distress Cardio:  regular rate and rhythm, no LE edema Abdomen:  abdomen soft, nontender; bowel sounds normal; no  masses or organomegaly Genital: Defer to GYN Musculoskeletal:  symmetrical muscle groups noted without atrophy or deformity Extremities:  no clubbing, cyanosis, or edema, no deformities, no skin discoloration Neuro:  gait normal; deep tendon reflexes normal and symmetric Psych: well oriented with normal range of affect and appropriate judgment/insight  Assessment and Plan  Well adult exam - Plan: CBC, Comprehensive metabolic panel, Lipid panel  Post-viral cough syndrome - Plan: Fluticasone Propionate, Inhal,  (FLOVENT DISKUS) 100 MCG/ACT AEPB  Need for influenza vaccination - Plan: Flu Vaccine QUAD 6+ mos PF IM (Fluarix Quad PF)   Well 51 y.o. female. Counseled on diet and exercise. Advanced directive form provided today.  Flu shot today.  Postviral cough syndrome: I do not hear any wheezing today.  We will start inhaled corticosteroid for the next few weeks.  If no improvement, would consider adding a long-acting beta agonist in addition to this versus PFTs versus referral to pulmonology.  She was encouraged to rinse her mouth out after use of the steroid inhaler.  She will let me know if things do not improve. Other orders as above. Follow up in 1 yr or prn. The patient voiced understanding and agreement to the plan.  [place], DO 04/24/22 10:56 AM

## 2022-04-24 NOTE — Patient Instructions (Signed)
Give us 2-3 business days to get the results of your labs back.   Keep the diet clean and stay active.  Please get me a copy of your advanced directive form at your convenience.   Let us know if you need anything.  

## 2022-07-08 ENCOUNTER — Other Ambulatory Visit: Payer: Self-pay | Admitting: Family Medicine

## 2022-07-08 DIAGNOSIS — R058 Other specified cough: Secondary | ICD-10-CM

## 2023-04-22 ENCOUNTER — Other Ambulatory Visit: Payer: Self-pay | Admitting: Family Medicine

## 2023-04-22 DIAGNOSIS — Z122 Encounter for screening for malignant neoplasm of respiratory organs: Secondary | ICD-10-CM

## 2023-04-28 ENCOUNTER — Encounter: Payer: Self-pay | Admitting: Family Medicine

## 2023-04-28 ENCOUNTER — Ambulatory Visit (INDEPENDENT_AMBULATORY_CARE_PROVIDER_SITE_OTHER): Payer: PRIVATE HEALTH INSURANCE | Admitting: Family Medicine

## 2023-04-28 VITALS — BP 120/75 | HR 79 | Temp 98.0°F | Ht 64.0 in | Wt 188.2 lb

## 2023-04-28 DIAGNOSIS — Z86718 Personal history of other venous thrombosis and embolism: Secondary | ICD-10-CM | POA: Diagnosis not present

## 2023-04-28 DIAGNOSIS — Z0000 Encounter for general adult medical examination without abnormal findings: Secondary | ICD-10-CM | POA: Diagnosis not present

## 2023-04-28 DIAGNOSIS — I8393 Asymptomatic varicose veins of bilateral lower extremities: Secondary | ICD-10-CM | POA: Diagnosis not present

## 2023-04-28 DIAGNOSIS — Z1231 Encounter for screening mammogram for malignant neoplasm of breast: Secondary | ICD-10-CM

## 2023-04-28 LAB — COMPREHENSIVE METABOLIC PANEL
ALT: 11 U/L (ref 0–35)
AST: 11 U/L (ref 0–37)
Albumin: 4.2 g/dL (ref 3.5–5.2)
Alkaline Phosphatase: 72 U/L (ref 39–117)
BUN: 18 mg/dL (ref 6–23)
CO2: 26 meq/L (ref 19–32)
Calcium: 9.2 mg/dL (ref 8.4–10.5)
Chloride: 107 meq/L (ref 96–112)
Creatinine, Ser: 0.88 mg/dL (ref 0.40–1.20)
GFR: 74.5 mL/min (ref >60.00)
Glucose, Bld: 101 mg/dL — ABNORMAL HIGH (ref 70–99)
Potassium: 3.8 meq/L (ref 3.5–5.1)
Sodium: 141 meq/L (ref 135–145)
Total Bilirubin: 0.3 mg/dL (ref 0.2–1.2)
Total Protein: 6.5 g/dL (ref 6.0–8.3)

## 2023-04-28 LAB — LIPID PANEL
Cholesterol: 186 mg/dL (ref 0–200)
HDL: 48.5 mg/dL (ref >39.00)
LDL Cholesterol: 119 mg/dL — ABNORMAL HIGH (ref 0–99)
NonHDL: 137.01
Total CHOL/HDL Ratio: 4
Triglycerides: 89 mg/dL (ref 0.0–149.0)
VLDL: 17.8 mg/dL (ref 0.0–40.0)

## 2023-04-28 LAB — CBC
HCT: 42.9 % (ref 36.0–46.0)
Hemoglobin: 13.9 g/dL (ref 12.0–15.0)
MCHC: 32.3 g/dL (ref 30.0–36.0)
MCV: 95.9 fL (ref 78.0–100.0)
Platelets: 261 10*3/uL (ref 150.0–400.0)
RBC: 4.47 Mil/uL (ref 3.87–5.11)
RDW: 14.1 % (ref 11.5–15.5)
WBC: 6.3 10*3/uL (ref 4.0–10.5)

## 2023-04-28 NOTE — Patient Instructions (Addendum)
Give [place] 2-3 business days to get the results of your labs back.   Keep the diet clean and stay active.  Aim to do some physical exertion for 150 minutes per week. This is typically divided into 5 days per week, 30 minutes per day. The activity should be enough to get your heart rate up. Anything is better than nothing if you have time constraints.  If you do not hear anything about your referral in the next 1-2 weeks, call our office and ask for an update.  Please get me a copy of your advanced directive form at your convenience.   Let us know if you need anything.

## 2023-04-28 NOTE — Progress Notes (Signed)
Chief Complaint  Patient presents with   Annual Exam     Well Woman Hannah Keith is here for a complete physical.   Her last physical was >1 year ago.  Current diet: in general, a "healthy" diet. Current exercise: none. Weight is down a few lbs and she denies fatigue out of ordinary. Seatbelt? Yes Advanced directive? Yes  Health Maintenance Pap/HPV- Yes Mammogram- Yes Colon cancer screening-Yes Shingrix- Yes Tetanus- Yes Hep C screening- Yes HIV screening- Yes  Past Medical History:  Diagnosis Date   Anxiety    Depression    Emphysema lung (HCC)    History of chicken pox    Panic attacks    PTSD (post-traumatic stress disorder)    Vaginal Pap smear, abnormal      Past Surgical History:  Procedure Laterality Date   LEEP  2001-2002    Medications  Current Outpatient Medications on File Prior to Visit  Medication Sig Dispense Refill   acetaminophen (TYLENOL) 325 MG tablet Take 650 mg by mouth every 6 (six) hours as needed.     albuterol (VENTOLIN HFA) 108 (90 Base) MCG/ACT inhaler Inhale 2 puffs into the lungs every 6 (six) hours as needed for wheezing or shortness of breath. 18 g 0   amphetamine-dextroamphetamine (ADDERALL) 20 MG tablet Take 20 mg by mouth 3 (three) times daily.     FLOVENT DISKUS 100 MCG/ACT AEPB INHALE 2 PUFFS INTO THE LUNGS IN THE MORNING AND AT BEDTIME. RINSE MOUTH AFTER USE 60 each 1   traZODone (DESYREL) 50 MG tablet Take 50 mg by mouth at bedtime as needed for sleep.     Allergies Allergies  Allergen Reactions   Nsaids     Cause GI Issues in large amounts   Penicillins Rash    Review of Systems: Constitutional:  no unexpected weight changes Eye:  no recent significant change in vision Ear/Nose/Mouth/Throat:  Ears:  no recent change in hearing Nose/Mouth/Throat:  no complaints of nasal congestion, no sore throat Cardiovascular: no chest pain Respiratory:  no shortness of breath Gastrointestinal:  no abdominal pain, no change in  bowel habits GU:  Female: negative for dysuria or pelvic pain Musculoskeletal/Extremities:  no pain of the joints Integumentary (Skin/Breast):  no abnormal skin lesions reported Neurologic:  no headaches Endocrine:  denies fatigue  Exam BP 120/75 (BP Location: Left Arm, Patient Position: Sitting, Cuff Size: Normal)   Pulse 79   Temp 98 F (36.7 C) (Oral)   Ht 5\' 4"  (1.626 m)   Wt 188 lb 4 oz (85.4 kg)   SpO2 97%   BMI 32.31 kg/m  General:  well developed, well nourished, in no apparent distress Skin:  no significant moles, warts, or growths Head:  no masses, lesions, or tenderness Eyes:  pupils equal and round, sclera anicteric without injection Ears:  canals without lesions, TMs shiny without retraction, no obvious effusion, no erythema Nose:  nares patent, mucosa normal, and no drainage  Throat/Pharynx:  lips and gingiva without lesion; tongue and uvula midline; non-inflamed pharynx; no exudates or postnasal drainage Neck: neck supple without adenopathy, thyromegaly, or masses Lungs:  clear to auscultation, breath sounds equal bilaterally, no respiratory distress Cardio:  regular rate and rhythm, no LE edema Abdomen:  abdomen soft, nontender; bowel sounds normal; no masses or organomegaly Genital: Defer to GYN Musculoskeletal:  symmetrical muscle groups noted without atrophy or deformity Extremities:  no clubbing, cyanosis, or edema, no deformities, no skin discoloration Neuro:  gait normal; deep tendon reflexes normal and  symmetric Psych: well oriented with normal range of affect and appropriate judgment/insight  Assessment and Plan  Well adult exam - Plan: CBC, Comprehensive metabolic panel, Lipid panel  Varicose veins of both lower extremities without ulcer or inflammation - Plan: Ambulatory referral to Vascular Surgery  History of DVT (deep vein thrombosis) - Plan: Ambulatory referral to Vascular Surgery   Well 52 y.o. female. Counseled on diet and exercise. Advanced  directive form requested today.  Other orders as above. Follow up in 1 yr. The patient voiced understanding and agreement to the plan.  Jilda Roche [place], DO 04/28/23 9:17 AM

## 2023-04-29 ENCOUNTER — Telehealth (HOSPITAL_BASED_OUTPATIENT_CLINIC_OR_DEPARTMENT_OTHER): Payer: Self-pay

## 2023-05-16 ENCOUNTER — Telehealth (HOSPITAL_BASED_OUTPATIENT_CLINIC_OR_DEPARTMENT_OTHER): Payer: Self-pay

## 2023-06-26 ENCOUNTER — Other Ambulatory Visit: Payer: Self-pay

## 2023-06-26 DIAGNOSIS — F17210 Nicotine dependence, cigarettes, uncomplicated: Secondary | ICD-10-CM

## 2023-06-26 DIAGNOSIS — Z87891 Personal history of nicotine dependence: Secondary | ICD-10-CM

## 2023-06-26 DIAGNOSIS — Z122 Encounter for screening for malignant neoplasm of respiratory organs: Secondary | ICD-10-CM

## 2023-06-27 ENCOUNTER — Encounter: Payer: Self-pay | Admitting: Family Medicine

## 2023-06-27 ENCOUNTER — Ambulatory Visit (INDEPENDENT_AMBULATORY_CARE_PROVIDER_SITE_OTHER): Payer: PRIVATE HEALTH INSURANCE | Admitting: Family Medicine

## 2023-06-27 VITALS — BP 110/74 | HR 70 | Temp 97.9°F | Ht 64.0 in | Wt 196.0 lb

## 2023-06-27 DIAGNOSIS — R051 Acute cough: Secondary | ICD-10-CM | POA: Diagnosis not present

## 2023-06-27 DIAGNOSIS — J0140 Acute pansinusitis, unspecified: Secondary | ICD-10-CM | POA: Diagnosis not present

## 2023-06-27 DIAGNOSIS — R21 Rash and other nonspecific skin eruption: Secondary | ICD-10-CM | POA: Diagnosis not present

## 2023-06-27 MED ORDER — BENZONATATE 100 MG PO CAPS: 100.0000 mg | ORAL_CAPSULE | Freq: Two times a day (BID) | ORAL | 0 refills | Status: DC | PRN

## 2023-06-27 MED ORDER — GUAIFENESIN ER 600 MG PO TB12: 1200.0000 mg | ORAL_TABLET | Freq: Two times a day (BID) | ORAL | 0 refills | Status: DC

## 2023-06-27 MED ORDER — DOXYCYCLINE HYCLATE 100 MG PO TABS: 100.0000 mg | ORAL_TABLET | Freq: Two times a day (BID) | ORAL | 0 refills | Status: AC

## 2023-06-27 MED ORDER — PREDNISONE 20 MG PO TABS: 40.0000 mg | ORAL_TABLET | Freq: Every day | ORAL | 0 refills | Status: AC

## 2023-06-27 MED ORDER — HYDROCOD POLI-CHLORPHE POLI ER 10-8 MG/5ML PO SUER: 5.0000 mL | Freq: Two times a day (BID) | ORAL | 0 refills | Status: AC | PRN

## 2023-06-27 MED ORDER — CLOTRIMAZOLE 1 % EX CREA
1.0000 | TOPICAL_CREAM | Freq: Two times a day (BID) | CUTANEOUS | 0 refills | Status: DC
Start: 2023-06-27 — End: 2024-01-15

## 2023-06-27 NOTE — Progress Notes (Signed)
Acute Office Visit  Subjective:     Patient ID: Hannah Keith, female    DOB: Dec 04, 1970, 52 y.o.   MRN: [id number]  Chief Complaint  Patient presents with   Cough     Patient is in today for URI.    Discussed the use of AI scribe software for clinical note transcription with the patient, who gave verbal consent to proceed.  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 The patient experienced a high fever, manifested by mouth blisters, a common occurrence for them during febrile episodes. Following the fever, the patient had a brief period of feeling better, only to experience a significant decline 2 days later. They reported severe body aches, back pain, and headaches, which extended into their ears, causing balance issues.  The patient's symptoms have been inconsistent, with periods of feeling well followed by episodes of feeling unwell. The primary concern at present is chest tightness/congestion, accompanied by a non-productive cough. The patient also reports a sensation of something constantly moving between their sinuses and throat. No pain with inspiration.   In addition to these symptoms, the patient has been experiencing discomfort in their left ear more than the right. They reported an unusual episode of pain radiating from their throat to their neck, ears, and head while swallowing water, which lasted for about a minute.  The patient also reported a skin issue on the bottom of their feet, which started about a month and a half ago. They described it as red, raised, and flaky, but not particularly itchy or painful. This is the first time they have experienced this issue.  The patient has been managing their symptoms with over-the-counter medications like DayQuil, NyQuil, and Emergen-C. They have a history of taking prednisone when  severely ill with COVID-19, which they reported as being beneficial.           All review of systems negative except what is listed in the HPI      Objective:    BP 110/74   Pulse 70   Temp 97.9 F (36.6 C)   Ht 5\' 4"  (1.626 m)   Wt 196 lb (88.9 kg)   SpO2 98%   BMI 33.64 kg/m    Physical Exam Vitals reviewed.  Constitutional:      Appearance: Normal appearance.  HENT:     Head: Normocephalic and atraumatic.     Comments: Maxillary sinus pain on palpation, left worse than right    Right Ear: A middle ear effusion is present.     Left Ear: A middle ear effusion is present.     Nose: Congestion and rhinorrhea present.     Mouth/Throat:     Comments: Cobblestoning/PND Eyes:     Extraocular Movements: Extraocular movements intact.     Conjunctiva/sclera: Conjunctivae normal.  Cardiovascular:     Rate and Rhythm: Normal rate and regular rhythm.  Pulmonary:     Effort: Pulmonary effort is normal.     Breath sounds: Normal breath sounds. No wheezing, rhonchi or rales.  Musculoskeletal:     Cervical back: Normal range of motion and neck supple.       Feet:  Lymphadenopathy:     Cervical: No cervical adenopathy.  Skin:    General: Skin is warm and dry.  Neurological:     Mental Status: She is alert and oriented to  person, place, and time.  Psychiatric:        Mood and Affect: Mood normal.        Behavior: Behavior normal.        Thought Content: Thought content normal.        Judgment: Judgment normal.     No results found for any visits on 06/27/23.      Assessment & Plan:   Problem List Items Addressed This Visit   None Visit Diagnoses       Rash of foot    -  Primary   Relevant Medications   clotrimazole (LOTRIMIN) 1 % cream     Acute non-recurrent pansinusitis       Relevant Medications   predniSONE (DELTASONE) 20 MG tablet   doxycycline (VIBRA-TABS) 100 MG tablet   guaiFENesin (MUCINEX) 600 MG 12 hr tablet   benzonatate (TESSALON) 100 MG  capsule   chlorpheniramine-HYDROcodone (TUSSIONEX) 10-8 MG/5ML     Acute cough       Relevant Medications   predniSONE (DELTASONE) 20 MG tablet   doxycycline (VIBRA-TABS) 100 MG tablet   guaiFENesin (MUCINEX) 600 MG 12 hr tablet   benzonatate (TESSALON) 100 MG capsule   chlorpheniramine-HYDROcodone (TUSSIONEX) 10-8 MG/5ML       Upper Respiratory Infection Symptoms of headache, body aches, fever, sinus pressure, and cough for two weeks. No fever in the past couple of days. Lungs sound clear on examination. -Start Doxycycline . -Start Prednisone -Use Flonase  -Use cough pearls twice a day as needed. -Use cough syrup with hydrocodone at night, avoid combining with Trazodone. -Continue supportive measures including rest, hydration, humidifier use, steam showers, warm compresses to sinuses, warm liquids with lemon and honey, and over-the-counter cough, cold, and analgesics as needed.  -Follow-up if not improving  Foot Dermatitis Red, raised, flaky patches on both feet for about a month. No itching or burning reported. Consider fungal vs eczematous etiology.  -Start antifungal cream. -Advise to keep feet dry and clean -Follow-up if not improving         Meds ordered this encounter  Medications   predniSONE (DELTASONE) 20 MG tablet    Sig: Take 2 tablets (40 mg total) by mouth daily with breakfast for 5 days.    Dispense:  10 tablet    Refill:  0    Supervising Provider:   Danise Edge A [[id number]]   doxycycline (VIBRA-TABS) 100 MG tablet    Sig: Take 1 tablet (100 mg total) by mouth 2 (two) times daily for 7 days.    Dispense:  14 tablet    Refill:  0    Supervising Provider:   Danise Edge A [[id number]]   guaiFENesin (MUCINEX) 600 MG 12 hr tablet    Sig: Take 2 tablets (1,200 mg total) by mouth 2 (two) times daily.    Dispense:  30 tablet    Refill:  0    Supervising Provider:   Danise Edge A [[id number]]   benzonatate (TESSALON) 100 MG capsule    Sig: Take 1 capsule (100 mg total)  by mouth 2 (two) times daily as needed for cough.    Dispense:  20 capsule    Refill:  0    Supervising Provider:   Danise Edge A [[id number]]   chlorpheniramine-HYDROcodone (TUSSIONEX) 10-8 MG/5ML    Sig: Take 5 mLs by mouth every 12 (twelve) hours as needed for up to 5 days.    Dispense:  50 mL    Refill:  0  Supervising Provider:   Danise Edge A [[id number]]   clotrimazole (LOTRIMIN) 1 % cream    Sig: Apply 1 Application topically 2 (two) times daily.    Dispense:  30 g    Refill:  0    Supervising Provider:   Danise Edge A [[id number]]    Return if symptoms worsen or fail to improve.  Clayborne Dana, NP

## 2023-07-03 ENCOUNTER — Ambulatory Visit (INDEPENDENT_AMBULATORY_CARE_PROVIDER_SITE_OTHER): Payer: PRIVATE HEALTH INSURANCE | Admitting: Acute Care

## 2023-07-03 DIAGNOSIS — F17210 Nicotine dependence, cigarettes, uncomplicated: Secondary | ICD-10-CM | POA: Diagnosis not present

## 2023-07-03 NOTE — Progress Notes (Signed)
 Provider Attestation I agree with the documentation of the Shared Decision Making visit,  smoking cessation counseling if appropriate, and verification or eligibility for lung cancer screening as documented by the RN Nurse Navigator.   Raejean Bullock, MSN, AGACNP-BC [facility] See Amion for personal pager PCCM on call pager [phone]     Virtual Visit via Telephone Note  I connected with Crissa Estorga on 07/03/23 at  1:00 PM EST by telephone and verified that I am speaking with the correct person using two identifiers.  Location: Patient: in home Provider: [address], [place], [place], [address]     Shared Decision Making Visit Lung Cancer Screening Program [phone])   Eligibility: Age 52 y.o. Pack Years Smoking History Calculation 52.5 (# packs/per year x # years smoked) Recent History of coughing up blood  no Unexplained weight loss? no ( >Than 15 pounds within the last 6 months ) Prior History Lung / other cancer no (Diagnosis within the last 5 years already requiring surveillance chest CT Scans). Smoking Status Current Smoker Former Smokers: Years since quit:  NA  Quit Date: NA  Visit Components: Discussion included one or more decision making aids. yes Discussion included risk/benefits of screening. yes Discussion included potential follow up diagnostic testing for abnormal scans. yes Discussion included meaning and risk of over diagnosis. yes Discussion included meaning and risk of False Positives. yes Discussion included meaning of total radiation exposure. yes  Counseling Included: Importance of adherence to annual lung cancer LDCT screening. yes Impact of comorbidities on ability to participate in the program. yes Ability and willingness to under diagnostic treatment. yes  Smoking Cessation Counseling: Current Smokers:  Discussed importance of smoking cessation. yes Information about tobacco cessation classes and  interventions provided to patient. yes Patient provided with "ticket" for LDCT Scan. yes Symptomatic Patient. NO  Counseling NA Diagnosis Code: Tobacco Use Z72.0 Asymptomatic Patient yes  Counseling (Intermediate counseling: > three minutes counseling) U1324 Former Smokers:  Discussed the importance of maintaining cigarette abstinence. yes Diagnosis Code: Personal History of Nicotine Dependence. M01.027 Information about tobacco cessation classes and interventions provided to patient. Yes Patient provided with "ticket" for LDCT Scan. yes Written Order for Lung Cancer Screening with LDCT placed in Epic. Yes (CT Chest Lung Cancer Screening Low Dose W/O CM) [id number] Z12.2-Screening of respiratory organs Z87.891-Personal history of nicotine dependence   Valentin Gaskins, RN 07/03/23

## 2023-07-03 NOTE — Patient Instructions (Signed)

## 2023-07-04 ENCOUNTER — Ambulatory Visit (HOSPITAL_BASED_OUTPATIENT_CLINIC_OR_DEPARTMENT_OTHER)
Admission: RE | Admit: 2023-07-04 | Discharge: 2023-07-04 | Disposition: A | Discharge status: Home / Self Care | Payer: PRIVATE HEALTH INSURANCE | Source: Ambulatory Visit | Attending: Acute Care | Admitting: Acute Care

## 2023-07-04 DIAGNOSIS — Z122 Encounter for screening for malignant neoplasm of respiratory organs: Secondary | ICD-10-CM

## 2023-07-04 DIAGNOSIS — J439 Emphysema, unspecified: Secondary | ICD-10-CM | POA: Diagnosis not present

## 2023-07-04 DIAGNOSIS — F17210 Nicotine dependence, cigarettes, uncomplicated: Secondary | ICD-10-CM

## 2023-07-04 DIAGNOSIS — R918 Other nonspecific abnormal finding of lung field: Secondary | ICD-10-CM | POA: Insufficient documentation

## 2023-07-04 DIAGNOSIS — Z87891 Personal history of nicotine dependence: Secondary | ICD-10-CM

## 2023-07-07 ENCOUNTER — Encounter (HOSPITAL_BASED_OUTPATIENT_CLINIC_OR_DEPARTMENT_OTHER): Payer: Self-pay

## 2023-07-07 ENCOUNTER — Ambulatory Visit (HOSPITAL_BASED_OUTPATIENT_CLINIC_OR_DEPARTMENT_OTHER)
Admission: RE | Admit: 2023-07-07 | Discharge: 2023-07-07 | Disposition: A | Discharge status: Home / Self Care | Payer: PRIVATE HEALTH INSURANCE | Source: Ambulatory Visit | Attending: Family Medicine | Admitting: Family Medicine

## 2023-07-07 DIAGNOSIS — Z1231 Encounter for screening mammogram for malignant neoplasm of breast: Secondary | ICD-10-CM | POA: Diagnosis present

## 2023-07-14 ENCOUNTER — Other Ambulatory Visit: Payer: Self-pay

## 2023-07-14 DIAGNOSIS — F17210 Nicotine dependence, cigarettes, uncomplicated: Secondary | ICD-10-CM

## 2023-07-14 DIAGNOSIS — Z87891 Personal history of nicotine dependence: Secondary | ICD-10-CM

## 2023-07-14 DIAGNOSIS — Z122 Encounter for screening for malignant neoplasm of respiratory organs: Secondary | ICD-10-CM

## 2023-10-04 ENCOUNTER — Encounter (HOSPITAL_BASED_OUTPATIENT_CLINIC_OR_DEPARTMENT_OTHER): Payer: Self-pay | Admitting: Radiology

## 2023-12-24 ENCOUNTER — Encounter (INDEPENDENT_AMBULATORY_CARE_PROVIDER_SITE_OTHER): Payer: Self-pay

## 2024-01-05 ENCOUNTER — Other Ambulatory Visit: Payer: Self-pay

## 2024-01-05 DIAGNOSIS — I872 Venous insufficiency (chronic) (peripheral): Secondary | ICD-10-CM

## 2024-01-15 ENCOUNTER — Encounter: Payer: Self-pay | Admitting: Student-PharmD

## 2024-01-15 ENCOUNTER — Ambulatory Visit (HOSPITAL_COMMUNITY)
Admission: RE | Admit: 2024-01-15 | Discharge: 2024-01-15 | Disposition: A | Discharge status: Home / Self Care | Payer: PRIVATE HEALTH INSURANCE | Source: Ambulatory Visit | Attending: Vascular Surgery | Admitting: Vascular Surgery

## 2024-01-15 ENCOUNTER — Ambulatory Visit: Payer: PRIVATE HEALTH INSURANCE | Admitting: Student-PharmD

## 2024-01-15 ENCOUNTER — Other Ambulatory Visit (HOSPITAL_COMMUNITY): Payer: Self-pay

## 2024-01-15 ENCOUNTER — Other Ambulatory Visit: Payer: Self-pay | Admitting: Vascular Surgery

## 2024-01-15 VITALS — BP 119/84 | HR 75 | Wt 197.0 lb

## 2024-01-15 DIAGNOSIS — I82412 Acute embolism and thrombosis of left femoral vein: Secondary | ICD-10-CM | POA: Insufficient documentation

## 2024-01-15 DIAGNOSIS — I872 Venous insufficiency (chronic) (peripheral): Secondary | ICD-10-CM | POA: Diagnosis present

## 2024-01-15 MED ORDER — APIXABAN (ELIQUIS) VTE STARTER PACK (10MG AND 5MG)
ORAL_TABLET | ORAL | 0 refills | Status: DC
  Filled 2024-01-15: qty 74, 30d supply, fill #0

## 2024-01-15 MED ORDER — APIXABAN 5 MG PO TABS: 5.0000 mg | ORAL_TABLET | Freq: Two times a day (BID) | ORAL | 1 refills | Status: DC

## 2024-01-15 NOTE — Progress Notes (Addendum)
 DVT Clinic Note  Name: Hannah Keith     MRN: [id number]     DOB: [birth year]     Sex: female  PCP: Frann Mabel Mt, DO  Today's Visit: Visit Information: Initial Visit  Referred to DVT Clinic by: Primary Care - Dolorosa Pachao, FNP ([facility]) Referred to CPP by: Dr. Lanis Reason for referral:  Chief Complaint  Patient presents with   DVT   HISTORY OF PRESENT ILLNESS: Hannah Keith is a 53 y.o. female with PMH RLE DVT in 2023, venous insufficiency, COPD, ADHD, who presents after diagnosis of DVT for medication management. Was treated with Eliquis  for her former DVT and tolerated this well. Reports that no cause was found for her previous DVT and she was treated for 7 or 8 months. She had right GSV laser vein ablation 06/07/22 followed by bilateral sclerotherapy 06/26/22. Started experiencing feeling of cramping in the LLE about 2 months ago that reminded her of the pain she experienced with her former RLE DVT. She was referred to [facility] for further follow up of venous insufficiency. Venous reflux study completed today and age-indeterminate DVT was found in the left femoral vein. The referring office referred her to DVT Clinic to start anticoagulation. Reports continued cramping in left leg. Denies swelling. Denies chest pain, SOB. Patient is originally from [place] and was planning to visit this fall. No recent injuries, illness, immobility, travel. Up to date on mammograms, colonoscopy. Due for pap smear. Still smokes daily.   Positive Thrombotic Risk Factors: Previous VTE, Smoking Bleeding Risk Factors: None Present  Negative Thrombotic Risk Factors: Recent surgery (within 3 months), Recent trauma (within 3 months), Paralysis, paresis, or recent plaster cast immobilization of lower extremity, Recent admission to hospital with acute illness (within 3 months), Central venous catheterization, Bed rest >72 hours within 3 months, Sedentary journey lasting >8 hours within 4 weeks, Pregnancy, Within  6 weeks postpartum, Recent cesarean section (within 3 months), Estrogen therapy, Testosterone therapy, Erythropoiesis-stimulating agent, Recent COVID diagnosis (within 3 months), Active cancer, Non-malignant, chronic inflammatory condition, Known thrombophilic condition, Obesity, Older age  Rx Insurance Coverage: Commercial Rx Affordability: Eliquis  is $35/month. Has copay card to reduce to $10/month.  Rx Assistance Provided: Co-pay card Preferred Pharmacy: Starter pack filled at [facility] today on-site. [facility] (required [facility] per insurance for maintenance meds).   Past Medical History:  Diagnosis Date   Anxiety    Depression    Emphysema lung (HCC)    History of chicken pox    Panic attacks    PTSD (post-traumatic stress disorder)    Vaginal Pap smear, abnormal     Past Surgical History:  Procedure Laterality Date   LEEP  2001-2002    Social History   Socioeconomic History   Marital status: Married    Spouse name: Not on file   Number of children: Not on file   Years of education: Not on file   Highest education level: Not on file  Occupational History   Not on file  Tobacco Use   Smoking status: Every Day    Current packs/day: 0.50    Average packs/day: 0.5 packs/day for 30.0 years (15.0 ttl pk-yrs)    Types: Cigarettes   Smokeless tobacco: Never  Vaping Use   Vaping status: Never Used  Substance and Sexual Activity   Alcohol use: Not Currently    Comment: social drinker -- 1-2 beers if out with friends   Drug use: Never  Sexual activity: Yes  Other Topics Concern   Not on file  Social History Narrative   Not on file   Social Drivers of Health   Financial Resource Strain: Not on file  Food Insecurity: Low Risk  (12/17/2023)   Received from [facility]   Hunger Vital Sign    Within the past 12 months, you worried that your food would run out before you got money to buy more: Never true    Within the  past 12 months, the food you bought just didn't last and you didn't have money to get more. : Never true  Transportation Needs: No Transportation Needs (12/17/2023)   Received from [facility]    In the past 12 months, has lack of reliable transportation kept you from medical appointments, meetings, work or from getting things needed for daily living? : No  Physical Activity: Not on file  Stress: Not on file  Social Connections: Not on file  Intimate Partner Violence: Not on file    Family History  Problem Relation Age of Onset   Depression Mother        Living   Diabetes Mother    Hyperlipidemia Mother    Hypertension Mother    Heart attack Father        Living   Heart disease Father    Hyperlipidemia Father    Hypertension Father    Arthritis/Rheumatoid Father    Thyroid  disease Sister    Diabetes Maternal Grandmother    Alcoholism Maternal Grandfather    Breast cancer Paternal Grandmother        PGGM-Breast Cancer   Depression Son    Ulcerative colitis Son        Age 69   Anxiety disorder Son    Depression Son        Age 65   Heart attack Maternal Uncle    Stroke Maternal Uncle    Heart disease Maternal Uncle    Heart attack Maternal Uncle    Breast cancer Paternal Aunt    Breast cancer Other    Healthy Neg Hx        Age 31   Colon cancer Neg Hx    Colon polyps Neg Hx    Esophageal cancer Neg Hx    Rectal cancer Neg Hx    Stomach cancer Neg Hx     Allergies as of 01/15/2024 - Review Complete 01/15/2024  Allergen Reaction Noted   Nsaids  02/02/2016   Penicillins Rash 02/02/2016    Current Outpatient Medications on File Prior to Visit  Medication Sig Dispense Refill   acetaminophen (TYLENOL) 325 MG tablet Take 650 mg by mouth every 6 (six) hours as needed.     amphetamine-dextroamphetamine (ADDERALL) 20 MG tablet Take 20 mg by mouth 3 (three) times daily.     citalopram  (CELEXA ) 20 MG tablet Take 20 mg by mouth daily.     clobetasol  ointment (TEMOVATE) 0.05 % Apply 1 Application topically 2 (two) times daily.     traZODone (DESYREL) 50 MG tablet Take 50 mg by mouth at bedtime as needed for sleep.     No current facility-administered medications on file prior to visit.   REVIEW OF SYSTEMS:  Review of Systems  Respiratory:  Negative for shortness of breath.   Cardiovascular:  Negative for chest pain, palpitations and leg swelling.  Musculoskeletal:  Positive for myalgias.  Neurological:  Negative for dizziness and tingling.   PHYSICAL EXAMINATION:  Vitals:  01/15/24 1544  BP: 119/84  Pulse: 75  SpO2: 95%  Weight: 197 lb (89.4 kg)    Body mass index is 33.81 kg/m.  Physical Exam Vitals reviewed.  Cardiovascular:     Rate and Rhythm: Normal rate.  Pulmonary:     Effort: Pulmonary effort is normal.  Musculoskeletal:        General: Tenderness present. No swelling.  Skin:    Findings: No bruising or erythema.  Psychiatric:        Mood and Affect: Mood normal.        Behavior: Behavior normal.        Thought Content: Thought content normal.   Villalta Score for Post-Thrombotic Syndrome: Pain: Mild Cramps: Mild Heaviness: Mild Paresthesia: Absent Pruritus: Absent Pretibial Edema: Absent Skin Induration: Absent Hyperpigmentation: Absent Redness: Absent Venous Ectasia: Mild Pain on calf compression: Absent Villalta Preliminary Score: 4 Is venous ulcer present?: No If venous ulcer is present and score is <15, then 15 points total are assigned: Absent Villalta Total Score: 4  LABS:  CBC     Component Value Date/Time   WBC 6.3 04/28/2023 0926   RBC 4.47 04/28/2023 0926   HGB 13.9 04/28/2023 0926   HCT 42.9 04/28/2023 0926   PLT 261.0 04/28/2023 0926   MCV 95.9 04/28/2023 0926   MCH 31.0 02/18/2022 0213   MCHC 32.3 04/28/2023 0926   RDW 14.1 04/28/2023 0926    Hepatic Function      Component Value Date/Time   PROT 6.5 04/28/2023 0926   ALBUMIN 4.2 04/28/2023 0926   AST 11 04/28/2023  0926   ALT 11 04/28/2023 0926   ALKPHOS 72 04/28/2023 0926   BILITOT 0.3 04/28/2023 0926    Renal Function   Lab Results  Component Value Date   CREATININE 0.88 04/28/2023   CREATININE 0.94 04/24/2022   CREATININE 1.04 (H) 02/18/2022    CrCl cannot be calculated (Patient's most recent lab result is older than the maximum 21 days allowed.).   [facility] Vascular Lab Studies:  01/15/24 VAS US  LOWER EXTREMITY VENOUS BILAT (DVT) Summary:  RIGHT:  - No evidence of deep vein thrombosis in the lower extremity. No indirect  evidence of obstruction proximal to the inguinal ligament.  - No cystic structure found in the popliteal fossa.  - Prior great saphenous vein ablation. No evidence of reflux in the  [place] or small saphenous vein where visualized.   LEFT:   - Findings consistent with age indeterminate deep vein thrombosis  involving the left femoral vein.  - No cystic structure found in the popliteal fossa.  - All other veins visualized appear fully compressible and demonstrate  appropriate Doppler characteristics.   ASSESSMENT: Location of DVT: Left femoral vein Cause of DVT: unprovoked  Patient with history of unprovoked right popliteal DVT in 2023, now diagnosed with an unprovoked age-indeterminate occlusive DVT in the left femoral vein. With this being her second DVT and both events being unprovoked, patient needs lifelong anticoagulation. Patient previously tolerated Eliquis  well for her last DVT. Will restart Eliquis  with VTE starter pack dosing. Counseled patient extensively on Eliquis . She has an activated copay card to reduce her costs. With recurrent unprovoked DVTs, will refer her to hematology for further workup and long-term management of anticoagulation. She has compression stockings at home though she's currently experiencing no swelling. Per Dr. Lanis, okay to keep varicose vein appointment next month with [facility].   PLAN: -Start apixaban  (Eliquis ) 10 mg twice daily for 7  days followed  by 5 mg twice daily. -Expected duration of therapy: Indefinite. Therapy started on 01/15/24. -Patient educated on purpose, proper use and potential adverse effects of apixaban  (Eliquis ). -Discussed importance of taking medication around the same time every day. -Advised patient of medications to avoid (NSAIDs, aspirin doses >100 mg daily). -Educated that Tylenol (acetaminophen) is the preferred analgesic to lower the risk of bleeding. -Advised patient to alert all providers of anticoagulation therapy prior to starting a new medication or having a procedure. -Emphasized importance of monitoring for signs and symptoms of bleeding (abnormal bruising, prolonged bleeding, nose bleeds, bleeding from gums, discolored urine, black tarry stools). -Educated patient to present to the ED if emergent signs and symptoms of new thrombosis occur. -Counseled patient to wear compression stockings daily, removing at night. Counseled on proper leg elevation.   Follow up: Referred to hematology. DVT Clinic as needed.   Lum Herald, PharmD, [place], CPP Deep Vein Thrombosis Clinic Clinical Pharmacist Practitioner [phone]  I have evaluated the patient's chart/imaging and refer this patient to the Clinical Pharmacist Practitioner for medication management. I have reviewed the CPP's documentation and agree with her assessment and plan. I was immediately available during the visit for questions and collaboration.   Fonda FORBES Rim, MD

## 2024-01-15 NOTE — Patient Instructions (Signed)
-  Start apixaban  (Eliquis ) 10 mg twice daily for 7 days followed by 5 mg twice daily. Pick this up downstairs at our pharmacy in the lobby.  -[facility]. You may need to call the pharmacy to ask them to fill this when you start to run low on your current supply.  -It is important to take your medication around the same time every day.  -Avoid NSAIDs like ibuprofen (Advil, Motrin) and naproxen  (Aleve ) as well as aspirin doses over 100 mg daily. -Tylenol (acetaminophen) is the preferred over the counter pain medication to lower the risk of bleeding. -Be sure to alert all of your health care providers that you are taking an anticoagulant prior to starting a new medication or having a procedure. -Monitor for signs and symptoms of bleeding (abnormal bruising, prolonged bleeding, nose bleeds, bleeding from gums, discolored urine, black tarry stools). If you have fallen and hit your head OR if your bleeding is severe or not stopping, seek emergency care.  -Go to the emergency room if emergent signs and symptoms of new clot occur (new or worse swelling and pain in an arm or leg, shortness of breath, chest pain, fast or irregular heartbeats, lightheadedness, dizziness, fainting, coughing up blood) or if you experience a significant color change (pale or blue) in the extremity that has the DVT.  -We recommend you wear compression stockings (20-30 mmHg) as long as you are having swelling or pain. Be sure to purchase the correct size and take them off at night.   If you have any questions or need to reschedule an appointment, please call [phone]. If you are having an emergency, call 911 or present to the nearest emergency room.   What is a DVT?  -Deep vein thrombosis (DVT) is a condition in which a blood clot forms in a vein of the deep venous system which can occur in the lower leg, thigh, pelvis, arm, or neck. This condition is serious and can be life-threatening if the  clot travels to the arteries of the lungs and causing a blockage (pulmonary embolism, PE). A DVT can also damage veins in the leg, which can lead to long-term venous disease, leg pain, swelling, discoloration, and ulcers or sores (post-thrombotic syndrome).  -Treatment may include taking an anticoagulant medication to prevent more clots from forming and the current clot from growing, wearing compression stockings, and/or surgical procedures to remove or dissolve the clot.

## 2024-01-16 ENCOUNTER — Telehealth: Payer: Self-pay

## 2024-01-16 NOTE — Telephone Encounter (Signed)
 Patient Advocate Encounter  Test billing for this patients current coverage returns a $35 copay for 30 days of both Eliquis  and Xarelto. This plan does prefer [facility]ills [facility].  Rachel DEL, CPhT Rx Patient Advocate Phone: [phone]

## 2024-01-26 ENCOUNTER — Encounter: Payer: Self-pay | Admitting: Hematology & Oncology

## 2024-02-05 ENCOUNTER — Ambulatory Visit: Payer: PRIVATE HEALTH INSURANCE | Attending: Vascular Surgery | Admitting: Physician Assistant

## 2024-02-05 VITALS — BP 105/72 | HR 85 | Temp 97.9°F | Wt 198.8 lb

## 2024-02-05 DIAGNOSIS — I781 Nevus, non-neoplastic: Secondary | ICD-10-CM | POA: Diagnosis not present

## 2024-02-05 DIAGNOSIS — I872 Venous insufficiency (chronic) (peripheral): Secondary | ICD-10-CM

## 2024-02-05 DIAGNOSIS — I82512 Chronic embolism and thrombosis of left femoral vein: Secondary | ICD-10-CM

## 2024-02-05 NOTE — Progress Notes (Signed)
 Requested by:  Pachao, Dolorosa, FNP [address] [place],  [place] [address]  Reason for consultation: venous insufficiency    History of Present Illness   Hannah Keith is a 53 y.o. (05-08-1971) female who presents for evaluation of venous insufficiency.  She says that she has been previously diagnosed with venous insufficiency but it has been a while since she has had a checkup.  Overall she just wants to make sure that her legs are doing okay.  She notes over the past couple of years she has had a drastic increase in the number of spider veins on her left lower leg.  These do not cause her any pain or discomfort.  She also has some varicose veins on her calves bilaterally, which occasionally dilate and cause her aching pain.  She does have bilateral lower leg swelling, but this is not very bothersome to her.  She does elevate her legs at home, which helps with her swelling.  She has previously tried compression stockings but cannot tolerate them due to her foot psoriasis.  She has a history of DVT in both of her legs so she is on chronic anticoagulation.  She also does have a history of right greater saphenous vein ablation and bilateral lower extremity varicose vein saline injections.  Past Medical History:  Diagnosis Date   Anxiety    Depression    Emphysema lung (HCC)    History of chicken pox    Panic attacks    PTSD (post-traumatic stress disorder)    Vaginal Pap smear, abnormal     Past Surgical History:  Procedure Laterality Date   LEEP  2001-2002    Social History   Socioeconomic History   Marital status: Married    Spouse name: Not on file   Number of children: Not on file   Years of education: Not on file   Highest education level: Not on file  Occupational History   Not on file  Tobacco Use   Smoking status: Every Day    Current packs/day: 0.50    Average packs/day: 0.5 packs/day for 30.0 years (15.0 ttl pk-yrs)    Types: Cigarettes   Smokeless tobacco:  Never  Vaping Use   Vaping status: Never Used  Substance and Sexual Activity   Alcohol use: Not Currently    Comment: social drinker -- 1-2 beers if out with friends   Drug use: Never   Sexual activity: Yes  Other Topics Concern   Not on file  Social History Narrative   Not on file   Social Drivers of Health   Financial Resource Strain: Not on file  Food Insecurity: Low Risk  (12/17/2023)   Received from [facility]   Hunger Vital Sign    Within the past 12 months, you worried that your food would run out before you got money to buy more: Never true    Within the past 12 months, the food you bought just didn't last and you didn't have money to get more. : Never true  Transportation Needs: No Transportation Needs (12/17/2023)   Received from [facility]    In the past 12 months, has lack of reliable transportation kept you from medical appointments, meetings, work or from getting things needed for daily living? : No  Physical Activity: Not on file  Stress: Not on file  Social Connections: Not on file  Intimate Partner Violence: Not on file    Family History  Problem Relation Age  of Onset   Depression Mother        Living   Diabetes Mother    Hyperlipidemia Mother    Hypertension Mother    Heart attack Father        Living   Heart disease Father    Hyperlipidemia Father    Hypertension Father    Arthritis/Rheumatoid Father    Thyroid  disease Sister    Diabetes Maternal Grandmother    Alcoholism Maternal Grandfather    Breast cancer Paternal Grandmother        PGGM-Breast Cancer   Depression Son    Ulcerative colitis Son        Age 31   Anxiety disorder Son    Depression Son        Age 50   Heart attack Maternal Uncle    Stroke Maternal Uncle    Heart disease Maternal Uncle    Heart attack Maternal Uncle    Breast cancer Paternal Aunt    Breast cancer Other    Healthy Neg Hx        Age 53   Colon cancer Neg Hx    Colon polyps Neg Hx     Esophageal cancer Neg Hx    Rectal cancer Neg Hx    Stomach cancer Neg Hx     Current Outpatient Medications  Medication Sig Dispense Refill   acetaminophen (TYLENOL) 325 MG tablet Take 650 mg by mouth every 6 (six) hours as needed.     amphetamine-dextroamphetamine (ADDERALL) 20 MG tablet Take 20 mg by mouth 3 (three) times daily.     apixaban  (ELIQUIS ) 5 MG TABS tablet Take 1 tablet (5 mg total) by mouth 2 (two) times daily. Start taking after completion of starter pack. 90 tablet 1   APIXABAN  (ELIQUIS ) VTE STARTER PACK (10MG  AND 5MG ) Take as directed on package: start with two-5mg  tablets twice daily for 7 days. On day 8, switch to one-5mg  tablet twice daily. 74 each 0   citalopram  (CELEXA ) 20 MG tablet Take 20 mg by mouth daily.     clobetasol ointment (TEMOVATE) 0.05 % Apply 1 Application topically 2 (two) times daily.     traZODone (DESYREL) 50 MG tablet Take 50 mg by mouth at bedtime as needed for sleep.     No current facility-administered medications for this visit.    Allergies  Allergen Reactions   Nsaids     Cause GI Issues in large amounts   Penicillins Rash    REVIEW OF SYSTEMS (negative unless checked):   Cardiac:  []  Chest pain or chest pressure? []  Shortness of breath upon activity? []  Shortness of breath when lying flat? []  Irregular heart rhythm?  Vascular:  []  Pain in calf, thigh, or hip brought on by walking? []  Pain in feet at night that wakes you up from your sleep? []  Blood clot in your veins? [x]  Leg swelling?  Pulmonary:  []  Oxygen at home? []  Productive cough? []  Wheezing?  Neurologic:  []  Sudden weakness in arms or legs? []  Sudden numbness in arms or legs? []  Sudden onset of difficult speaking or slurred speech? []  Temporary loss of vision in one eye? []  Problems with dizziness?  Gastrointestinal:  []  Blood in stool? []  Vomited blood?  Genitourinary:  []  Burning when urinating? []  Blood in urine?  Psychiatric:  []  Major  depression  Hematologic:  []  Bleeding problems? []  Problems with blood clotting?  Dermatologic:  []  Rashes or ulcers?  Constitutional:  []  Fever or chills?  Ear/Nose/Throat:  []  Change in hearing? []  Nose bleeds? []  Sore throat?  Musculoskeletal:  []  Back pain? []  Joint pain? []  Muscle pain?   Physical Examination     Vitals:   02/05/24 1041  BP: 105/72  Pulse: 85  Temp: 97.9 F (36.6 C)  TempSrc: Temporal  Weight: 198 lb 12.8 oz (90.2 kg)   Body mass index is 34.12 kg/m.  General:  WDWN in NAD; vital signs documented above Gait: Not observed HENT: WNL, normocephalic Pulmonary: normal non-labored breathing  Cardiac: regular Abdomen: soft, NT, no masses Skin: without rashes Vascular Exam/Pulses: palpable pedal pulses Extremities: BLE with small varicose veins in the calves, with reticular veins, without edema, without stasis pigmentation, without lipodermatosclerosis, without ulcers Musculoskeletal: no muscle wasting or atrophy  Neurologic: A&O X 3;  No focal weakness or paresthesias are detected Psychiatric:  The pt has Normal affect.  Non-invasive Vascular Imaging   BLE DVT Study (01/15/2024):   RIGHT    CompressibilityPhasicitySpontaneityPropertiesThrombus  Aging  +---------+---------------+---------+-----------+----------+--------------+   CFV     Full           Yes      Yes                                   +---------+---------------+---------+-----------+----------+--------------+   SFJ     Full           Yes      Yes                                   +---------+---------------+---------+-----------+----------+--------------+   FV Prox  Full           Yes      Yes                                   +---------+---------------+---------+-----------+----------+--------------+   FV Mid   Full                                                           +---------+---------------+---------+-----------+----------+--------------+   FV DistalFull                                                          +---------+---------------+---------+-----------+----------+--------------+   PFV     Full                    Yes                                   +---------+---------------+---------+-----------+----------+--------------+   POP     Full           Yes      Yes                                   +---------+---------------+---------+-----------+----------+--------------+  Right Technical Findings:  Prior great saphenous vein ablation. No evidence of reflux in the  [place] or small saphenous vein where visualized.    +---------+---------------+---------+-----------+---------------+----------  ----+  LEFT    CompressibilityPhasicitySpontaneityProperties     Thrombus  Aging  +---------+---------------+---------+-----------+---------------+----------  ----+  CFV     Full           Yes      Yes                                        +---------+---------------+---------+-----------+---------------+----------  ----+  SFJ     Full           Yes      Yes                                        +---------+---------------+---------+-----------+---------------+----------  ----+  FV Prox  Full           Yes      Yes                                        +---------+---------------+---------+-----------+---------------+----------  ----+  FV Mid   None           No       No         softly and     Age                                                          brightly        Indeterminate                                               echogenic                       +---------+---------------+---------+-----------+---------------+----------  ----+  FV DistalNone           No       No         softly and     Age                                                           brightly        Indeterminate                                               echogenic                       +---------+---------------+---------+-----------+---------------+----------  ----+  PFV  Full                    Yes                                        +---------+---------------+---------+-----------+---------------+----------  ----+  POP     Full           Yes      Yes                                        +---------+---------------+---------+-----------+---------------+----------  ----+  PTV     Full                                                               +---------+---------------+---------+-----------+---------------+----------  ----+  PERO    Full                                                               +---------+---------------+---------+-----------+---------------+----------  ----+    Medical Decision Making   Hannah Keith is a 53 y.o. female who presents for evaluation of venous insufficiency  Based on the patient's ultrasound, there is evidence of a previous right greater saphenous vein ablation.  There is also evidence of a age-indeterminate thrombus in the left femoral vein.  She was started on anticoagulation for her left lower extremity DVT, which will likely be lifelong given her previous history of right lower extremity DVT. The patient states she has been previously diagnosed with venous insufficiency of bilateral lower extremities.  She does have a previous right greater saphenous vein ablation.  Overall her symptoms are well-controlled but she wanted to have her legs checked just in case there were any issues. She endorses bilateral lower leg swelling, which is stable and not very bothersome.  She does elevate her legs in the evening to help with her swelling.  She cannot tolerate any compression stockings due to her foot psoriasis.  She is currently more bothered by the increase in spider veins in  her left lower leg over the past couple years.  She also does experience aching pain in her calves sometimes due to her varicose veins. On exam she does not have any significant lower extremity edema.  She has some small varicose veins in her proximal, medial calves bilaterally.  She has several small spider veins in her left lower leg. I have encouraged the patient to continue her anticoagulation for her incidental findings of left lower extremity chronic DVT.  She does have a pending evaluation by hematology/oncology.  I have also reassured her that her spider and varicose veins do not pose any threat to her limb or life.  Her symptoms should continue to be well-controlled with daily leg elevation, exercise, and avoiding prolonged sitting and standing. She can follow-up with our office as needed   Hannah Reaver Hannah Holster, PA-C [facility] Office: [phone]  02/05/2024, 2:37 PM  Call MD: Gretta

## 2024-03-02 ENCOUNTER — Other Ambulatory Visit: Payer: Self-pay | Admitting: Family

## 2024-03-02 DIAGNOSIS — I82431 Acute embolism and thrombosis of right popliteal vein: Secondary | ICD-10-CM

## 2024-03-02 DIAGNOSIS — Z0000 Encounter for general adult medical examination without abnormal findings: Secondary | ICD-10-CM

## 2024-03-02 DIAGNOSIS — I82412 Acute embolism and thrombosis of left femoral vein: Secondary | ICD-10-CM

## 2024-03-02 DIAGNOSIS — D6859 Other primary thrombophilia: Secondary | ICD-10-CM

## 2024-03-03 ENCOUNTER — Inpatient Hospital Stay: Payer: PRIVATE HEALTH INSURANCE | Admitting: Family

## 2024-03-03 ENCOUNTER — Inpatient Hospital Stay: Payer: PRIVATE HEALTH INSURANCE | Attending: Hematology & Oncology

## 2024-03-03 ENCOUNTER — Encounter: Payer: Self-pay | Admitting: Family

## 2024-03-03 VITALS — BP 109/76 | HR 69 | Temp 98.2°F | Resp 18 | Ht 64.0 in | Wt 199.8 lb

## 2024-03-03 DIAGNOSIS — I82431 Acute embolism and thrombosis of right popliteal vein: Secondary | ICD-10-CM

## 2024-03-03 DIAGNOSIS — Z7901 Long term (current) use of anticoagulants: Secondary | ICD-10-CM | POA: Insufficient documentation

## 2024-03-03 DIAGNOSIS — Z86718 Personal history of other venous thrombosis and embolism: Secondary | ICD-10-CM | POA: Diagnosis not present

## 2024-03-03 DIAGNOSIS — Z79899 Other long term (current) drug therapy: Secondary | ICD-10-CM | POA: Insufficient documentation

## 2024-03-03 DIAGNOSIS — F17210 Nicotine dependence, cigarettes, uncomplicated: Secondary | ICD-10-CM | POA: Insufficient documentation

## 2024-03-03 DIAGNOSIS — I82412 Acute embolism and thrombosis of left femoral vein: Secondary | ICD-10-CM | POA: Diagnosis present

## 2024-03-03 DIAGNOSIS — D6859 Other primary thrombophilia: Secondary | ICD-10-CM

## 2024-03-03 DIAGNOSIS — Z803 Family history of malignant neoplasm of breast: Secondary | ICD-10-CM

## 2024-03-03 LAB — CBC WITH DIFFERENTIAL (CANCER CENTER ONLY)
Abs Immature Granulocytes: 0.01 K/uL (ref 0.00–0.07)
Basophils Absolute: 0 K/uL (ref 0.0–0.1)
Basophils Relative: 1 %
Eosinophils Absolute: 0.1 K/uL (ref 0.0–0.5)
Eosinophils Relative: 2 %
HCT: 42.5 % (ref 36.0–46.0)
Hemoglobin: 14.6 g/dL (ref 12.0–15.0)
Immature Granulocytes: 0 %
Lymphocytes Relative: 50 %
Lymphs Abs: 3.7 K/uL (ref 0.7–4.0)
MCH: 31.1 pg (ref 26.0–34.0)
MCHC: 34.4 g/dL (ref 30.0–36.0)
MCV: 90.6 fL (ref 80.0–100.0)
Monocytes Absolute: 0.5 K/uL (ref 0.1–1.0)
Monocytes Relative: 8 %
Neutro Abs: 2.9 K/uL (ref 1.7–7.7)
Neutrophils Relative %: 39 %
Platelet Count: 295 K/uL (ref 150–400)
RBC: 4.69 MIL/uL (ref 3.87–5.11)
RDW: 13.8 % (ref 11.5–15.5)
WBC Count: 7.2 K/uL (ref 4.0–10.5)
nRBC: 0 % (ref 0.0–0.2)

## 2024-03-03 LAB — CMP (CANCER CENTER ONLY)
ALT: 18 U/L (ref 0–44)
AST: 16 U/L (ref 15–41)
Albumin: 4.4 g/dL (ref 3.5–5.0)
Alkaline Phosphatase: 78 U/L (ref 38–126)
Anion gap: 13 (ref 5–15)
BUN: 15 mg/dL (ref 6–20)
CO2: 22 mmol/L (ref 22–32)
Calcium: 9.9 mg/dL (ref 8.9–10.3)
Chloride: 105 mmol/L (ref 98–111)
Creatinine: 0.93 mg/dL (ref 0.44–1.00)
GFR, Estimated: >60 mL/min (ref >60)
Glucose, Bld: 99 mg/dL (ref 70–99)
Potassium: 4 mmol/L (ref 3.5–5.1)
Sodium: 140 mmol/L (ref 135–145)
Total Bilirubin: 0.3 mg/dL (ref 0.0–1.2)
Total Protein: 7.4 g/dL (ref 6.5–8.1)

## 2024-03-03 LAB — D-DIMER, QUANTITATIVE: D-Dimer, Quant: <0.27 ug{FEU}/mL (ref 0.00–0.50)

## 2024-03-03 LAB — LACTATE DEHYDROGENASE: LDH: 187 U/L (ref 98–192)

## 2024-03-03 LAB — ANTITHROMBIN III: AntiThromb III Func: 104 % (ref 75–120)

## 2024-03-03 NOTE — Progress Notes (Signed)
 Hematology/Oncology Consultation   Name: Hannah Keith      MRN: [id number]    Location: Room/bed info not found  Date: 03/03/2024 Time:3:32 PM   REFERRING PHYSICIAN:  Fonda Rim, MD  REASON FOR CONSULT:  DVT of the femoral vein of the LLE   DIAGNOSIS:  History of RLE DVT dx 10/2021 - resolved  New LLE DVT dx 01/2024   HISTORY OF PRESENT ILLNESS:  Hannah Keith is a very pleasant 53 yo female with history right lower extremity DVT of the popliteal vein diagnosed in 2023. She was treated with 8-9 months of Eliquis  and repeat imaging showed resolution of clot.  She has history of varicose veins as well as PVD and is followed by vascular surgery.  No history of stroke or heart attack. No known familial history of thrombotic event.  She denies travel, illness or injury prior to either of her DVT's.  She was diagnosed with the left femoral DVT in July and is currently on Eliquis  5 mg PO BID.  She is tolerating this nicely and has not noted any blood loss. No abnormal bruising, no petechiae.  No pain or swelling in her extremities at this time. She states that her legs get sore and ankles will swell if she sits or stands for an extended period of time.  She us  a smoker and has cut back from 1 ppd to 5 cigarettes/day. She is waiting on her Chantix prescription to come in. She is ready to quit.  No hormone replacement therapy.  She has 2 sons and no history of miscarriage.  No history of diabetes or thyroid  disease.  No personal history of cancer. Strong paternal family history of breast cancer including great grandmother, grandmother, multiple aunts and first cousins. She is unsure if any genetic testing has been done as they live in [place]. She has been having mammogram since her mid 20's.  Mammogram in January was negative.  Colonoscopy in 06/2020. She had multiple benign polyps removed from throughout the colon and was also noted to have non bleeding internal hemorrhoids.  No fever, chills, n/v,  cough, rash, dizziness, SOB, chest pain, palpitations, abdominal pain or changes in bowel or bladder habits.  No falls or syncope reported.  No ETOH or recreational drug use.  Appetite is good. But she admits that she needs to better hydrate throughout the day.  Weight is stable at 199 lbs.  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   ROS: All other 10 point review of systems is negative.   PAST MEDICAL HISTORY:   Past Medical History:  Diagnosis Date   Anxiety    Depression    Emphysema lung (HCC)    History of chicken pox    Panic attacks    PTSD (post-traumatic stress disorder)    Vaginal Pap smear, abnormal     ALLERGIES: Allergies  Allergen Reactions   Nsaids     Cause GI Issues in large amounts   Penicillins Rash      MEDICATIONS:  Current Outpatient Medications on File Prior to Visit  Medication Sig Dispense Refill   acetaminophen (TYLENOL) 325 MG tablet Take 650 mg by mouth every 6 (six) hours as needed.     amphetamine-dextroamphetamine (ADDERALL) 20 MG tablet Take 20 mg by mouth 3 (three) times daily.     apixaban  (ELIQUIS ) 5 MG TABS tablet Take 1 tablet (5 mg total) by mouth 2 (two)  times daily. Start taking after completion of starter pack. 90 tablet 1   APIXABAN  (ELIQUIS ) VTE STARTER PACK (10MG  AND 5MG ) Take as directed on package: start with two-5mg  tablets twice daily for 7 days. On day 8, switch to one-5mg  tablet twice daily. 74 each 0   citalopram  (CELEXA ) 20 MG tablet Take 20 mg by mouth daily.     clobetasol ointment (TEMOVATE) 0.05 % Apply 1 Application topically 2 (two) times daily.     traZODone (DESYREL) 50 MG tablet Take 50 mg by mouth at bedtime as needed for sleep.     No current facility-administered medications on file prior to visit.     PAST SURGICAL HISTORY Past Surgical History:  Procedure Laterality Date   LEEP  2001-2002    FAMILY HISTORY: Family History  Problem Relation Age of Onset    Depression Mother        Living   Diabetes Mother    Hyperlipidemia Mother    Hypertension Mother    Heart attack Father        Living   Heart disease Father    Hyperlipidemia Father    Hypertension Father    Arthritis/Rheumatoid Father    Thyroid  disease Sister    Diabetes Maternal Grandmother    Alcoholism Maternal Grandfather    Breast cancer Paternal Grandmother        PGGM-Breast Cancer   Depression Son    Ulcerative colitis Son        Age 87   Anxiety disorder Son    Depression Son        Age 65   Heart attack Maternal Uncle    Stroke Maternal Uncle    Heart disease Maternal Uncle    Heart attack Maternal Uncle    Breast cancer Paternal Aunt    Breast cancer Other    Healthy Neg Hx        Age 40   Colon cancer Neg Hx    Colon polyps Neg Hx    Esophageal cancer Neg Hx    Rectal cancer Neg Hx    Stomach cancer Neg Hx     SOCIAL HISTORY:  reports that she has been smoking cigarettes. She has a 15 pack-year smoking history. She has never used smokeless tobacco. She reports that she does not currently use alcohol. She reports that she does not use drugs.  PERFORMANCE STATUS: The patient's performance status is 1 - Symptomatic but completely ambulatory  PHYSICAL EXAM: Most Recent Vital Signs: There were no vitals taken for this visit. There were no vitals taken for this visit.  General Appearance:    Alert, cooperative, no distress, appears stated age  Head:    Normocephalic, without obvious abnormality, atraumatic  Eyes:    PERRL, conjunctiva/corneas clear, EOM's intact, fundi    benign, both eyes        Throat:   Lips, mucosa, and tongue normal; teeth and gums normal  Neck:   Supple, symmetrical, trachea midline, no adenopathy;    thyroid :  no enlargement/tenderness/nodules; no carotid   bruit or JVD  Back:     Symmetric, no curvature, ROM normal, no CVA tenderness  Lungs:     Clear to auscultation bilaterally, respirations unlabored  Chest Wall:    No  tenderness or deformity   Heart:    Regular rate and rhythm, S1 and S2 normal, no murmur, rub   or gallop     Abdomen:     Soft, non-tender, bowel sounds active  all four quadrants,    no masses, no organomegaly        Extremities:   Extremities normal, atraumatic, no cyanosis or edema  Pulses:   2+ and symmetric all extremities  Skin:   Skin color, texture, turgor normal, no rashes or lesions  Lymph nodes:   Cervical, supraclavicular, and axillary nodes normal  Neurologic:   CNII-XII intact, normal strength, sensation and reflexes    throughout    LABORATORY DATA:  No results found for this or any previous visit (from the past 48 hours).    RADIOGRAPHY: No results found.     PATHOLOGY: None  ASSESSMENT/PLAN: Ms. Mecum is a very pleasant 53 yo female with history right lower extremity DVT of the popliteal vein diagnosed in 2023 and now a new DVT diagnosed in July 2025 likely secondary to smoking/varicose veins and PVD.  She is tolerating Eliquis  nicely.  Hyper coag panel pending to rule out any underlying clotting disease.   She is working on quitting smoking.   Follow-up and repeat US  in 2 months.   All questions were answered. The patient knows to call the clinic with any problems, questions or concerns. We can certainly see the patient much sooner if necessary.  The patient was discussed with Dr. Timmy and he is in agreement with the aforementioned.   Lauraine Pepper, NP

## 2024-03-04 LAB — DRVVT CONFIRM: dRVVT Confirm: 1 ratio (ref 0.8–1.2)

## 2024-03-04 LAB — PROTEIN S ACTIVITY: Protein S Activity: 107 % (ref 63–140)

## 2024-03-04 LAB — LUPUS ANTICOAGULANT PANEL
DRVVT: 48 s — ABNORMAL HIGH (ref 0.0–47.0)
PTT Lupus Anticoagulant: 33.4 s (ref 0.0–43.5)

## 2024-03-04 LAB — CARDIOLIPIN ANTIBODIES, IGG, IGM, IGA
Anticardiolipin IgA: <9 U/mL (ref 0–11)
Anticardiolipin IgG: <9 GPL U/mL (ref 0–14)
Anticardiolipin IgM: <9 [MPL'U]/mL (ref 0–12)

## 2024-03-04 LAB — PROTEIN C ACTIVITY: Protein C Activity: 141 % (ref 73–180)

## 2024-03-04 LAB — DRVVT MIX: dRVVT Mix: 42.8 s — ABNORMAL HIGH (ref 0.0–40.4)

## 2024-03-04 LAB — PROTEIN S, TOTAL: Protein S Ag, Total: 113 % (ref 60–150)

## 2024-03-05 LAB — BETA-2-GLYCOPROTEIN I ABS, IGG/M/A
Beta-2 Glyco I IgG: <9 GPI IgG units (ref 0–20)
Beta-2-Glycoprotein I IgA: <9 GPI IgA units (ref 0–25)
Beta-2-Glycoprotein I IgM: <9 GPI IgM units (ref 0–32)

## 2024-03-05 LAB — PROTEIN C, TOTAL: Protein C, Total: 124 % (ref 60–150)

## 2024-03-05 LAB — HOMOCYSTEINE: Homocysteine: 12 umol/L (ref 0.0–14.5)

## 2024-03-09 LAB — PROTHROMBIN GENE MUTATION

## 2024-03-09 LAB — FACTOR 5 LEIDEN

## 2024-03-10 ENCOUNTER — Ambulatory Visit (INDEPENDENT_AMBULATORY_CARE_PROVIDER_SITE_OTHER): Payer: PRIVATE HEALTH INSURANCE | Admitting: Audiology

## 2024-03-10 ENCOUNTER — Ambulatory Visit (INDEPENDENT_AMBULATORY_CARE_PROVIDER_SITE_OTHER): Payer: PRIVATE HEALTH INSURANCE | Admitting: Physician Assistant

## 2024-03-10 ENCOUNTER — Encounter (INDEPENDENT_AMBULATORY_CARE_PROVIDER_SITE_OTHER): Payer: Self-pay | Admitting: Physician Assistant

## 2024-03-10 ENCOUNTER — Ambulatory Visit: Payer: Self-pay

## 2024-03-10 VITALS — BP 107/72 | HR 70

## 2024-03-10 DIAGNOSIS — Z0110 Encounter for examination of ears and hearing without abnormal findings: Secondary | ICD-10-CM | POA: Diagnosis not present

## 2024-03-10 DIAGNOSIS — H93292 Other abnormal auditory perceptions, left ear: Secondary | ICD-10-CM

## 2024-03-10 DIAGNOSIS — H9202 Otalgia, left ear: Secondary | ICD-10-CM

## 2024-03-10 MED ORDER — CLONAZEPAM 0.5 MG PO TABS: 0.5000 mg | ORAL_TABLET | Freq: Two times a day (BID) | ORAL | 1 refills | Status: DC | PRN

## 2024-03-10 NOTE — Progress Notes (Signed)
 Dear Dr. Pachao, Here is my assessment for our mutual patient, Hannah Keith. Thank you for allowing me the opportunity to care for your patient. Please do not hesitate to contact me should you have any other questions. Sincerely, Hannah Cohen PA-C  Otolaryngology Clinic Note Referring provider: Dr. Thamas HPI:  Hannah Keith is a 53 y.o. female kindly referred by Dr. Pachao   The patient is a 53 year old female seen in our office for evaluation of abnormal ear sensation.  The patient notes that over the last 8 months she has had intermittent abnormal sensation in her left ear.  She notes that it feels like something is moving in her ear, almost like a crawling sensation.  She has been seen several times while the symptoms are happening by medical providers with no significant abnormalities.  She notes that it last several minutes and then completely resolves, she denies any associated hearing loss, clicking or popping, ringing in the ear.  She denies any significant pain.  No neurologic deficits.  She denies any preceding history of the same.  She feels like her hearing is normal.  No trauma to the ears, no history recurrent ear infections.  She notes usually it happens daily but there can be times where it does not happen for several days.   Independent Review of Additional Tests or Records:  Audiological evaluation on 03/10/2024  Normal audiological evaluation     PMH/Meds/All/SocHx/FamHx/ROS:   Past Medical History:  Diagnosis Date   Anxiety    Depression    Emphysema lung (HCC)    History of chicken pox    Panic attacks    PTSD (post-traumatic stress disorder)    Vaginal Pap smear, abnormal      Past Surgical History:  Procedure Laterality Date   LEEP  2001-2002    Family History  Problem Relation Age of Onset   Depression Mother        Living   Diabetes Mother    Hyperlipidemia Mother    Hypertension Mother    Heart attack Father        Living   Heart disease Father     Hyperlipidemia Father    Hypertension Father    Arthritis/Rheumatoid Father    Thyroid  disease Sister    Diabetes Maternal Grandmother    Alcoholism Maternal Grandfather    Breast cancer Paternal Grandmother        PGGM-Breast Cancer   Depression Son    Ulcerative colitis Son        Age 81   Anxiety disorder Son    Depression Son        Age 45   Heart attack Maternal Uncle    Stroke Maternal Uncle    Heart disease Maternal Uncle    Heart attack Maternal Uncle    Breast cancer Paternal Aunt    Breast cancer Other    Healthy Neg Hx        Age 67   Colon cancer Neg Hx    Colon polyps Neg Hx    Esophageal cancer Neg Hx    Rectal cancer Neg Hx    Stomach cancer Neg Hx      Social Connections: Not on file      Current Outpatient Medications:    acetaminophen (TYLENOL) 325 MG tablet, Take 650 mg by mouth every 6 (six) hours as needed., Disp: , Rfl:    amphetamine-dextroamphetamine (ADDERALL) 20 MG tablet, Take 20 mg by mouth 3 (three) times daily., Disp: ,  Rfl:    apixaban  (ELIQUIS ) 5 MG TABS tablet, Take 1 tablet (5 mg total) by mouth 2 (two) times daily. Start taking after completion of starter pack., Disp: 90 tablet, Rfl: 1   APIXABAN  (ELIQUIS ) VTE STARTER PACK (10MG  AND 5MG ), Take as directed on package: start with two-5mg  tablets twice daily for 7 days. On day 8, switch to one-5mg  tablet twice daily., Disp: 74 each, Rfl: 0   citalopram  (CELEXA ) 20 MG tablet, Take 20 mg by mouth daily., Disp: , Rfl:    clobetasol ointment (TEMOVATE) 0.05 %, Apply 1 Application topically 2 (two) times daily., Disp: , Rfl:    traZODone (DESYREL) 50 MG tablet, Take 50 mg by mouth at bedtime as needed for sleep., Disp: , Rfl:    Physical Exam:   BP 107/72 (BP Location: Right Arm, Patient Position: Sitting, Cuff Size: Normal)   Pulse 70   SpO2 96%   Pertinent Findings  CN II-XII intact Bilateral EAC clear and TM intact with well pneumatized middle ear spaces Anterior rhinoscopy: Septum  midline; bilateral inferior turbinates with no hypertrophy No lesions of oral cavity/oropharynx; dentition within normal limits No obviously palpable neck masses/lymphadenopathy/thyromegaly No respiratory distress or stridor  Seprately Identifiable Procedures:  None  Impression & Plans:  Savonna Birchmeier is a 53 y.o. female with the following   Abnormal ear sensation-  This is a 53 year old female seen in our office for evaluation of an abnormal ear sensation on the left.  Unclear etiology at this time.  She does have the sensation of something moving in her left ear, this is brief.  I do have some suspicion for middle ear myoclonus although no associated sounds with this.  Does follow this bothersome but does not significantly impact her quality of life.  I did offer a trial of clonazepam  to see if this would help with the symptoms.  I discussed the risks and benefits of using this, she understands she cannot work, drive or operate any heavy machinery, or drink alcohol while taking.  I suggest she try this while at home, she will do this intermittently keeping a journal of her symptoms if the symptoms do improve while taking this medication the likelihood of middle ear myoclonus is more likely although not definitive.  She will update me on her symptoms, or any changes she may have.  She understands that this is not a long-term treatment and if it does help her symptoms it is more diagnostic rather than treatment.  The patient verbalized understanding and agreement to today's plan had no further questions or concerns.   - f/u PRN   Thank you for allowing me the opportunity to care for your patient. Please do not hesitate to contact me should you have any other questions.  Sincerely, Hannah Cohen PA-C [facility] Phone: [phone] Fax: [phone]  03/10/2024, 10:25 AM

## 2024-03-10 NOTE — Progress Notes (Signed)
  [address], [address] [place], [place] [address] [phone]  Audiological Evaluation    Name: Hannah Keith     DOB:   [birth year]      MRN:   [id number]                                                                                     Service Date: 03/10/2024     Accompanied by: unaccompanied   Patient comes today after Reyes Cohen, PA-C sent a referral for a hearing evaluation due to concerns with tinnitus.   Symptoms Yes Details  Hearing loss  [x]  Maybe per  husband  Tinnitus  []    Ear pain/ infections/pressure  [x]  Left ear fullness with onset January 2025, comes and goes  Balance problems  [x]  Patient reports spinning sensation for several years, comes and goes, no known trigger and no perceived secondary symptoms. Reports a a sway when walking to the right .  Noise exposure history  []    Previous ear surgeries  []    Family history of hearing loss  []    Amplification  []    Other  [x]  Some headaches and migraines- rarely    Otoscopy: Right ear: Clear external ear canal and notable landmarks visualized on the tympanic membrane. Left ear:  Clear external ear canal and notable landmarks visualized on the tympanic membrane.  Tympanometry: Right ear: Type A- Normal external ear canal volume with normal middle ear pressure and tympanic membrane compliance. Left ear: Type A- Normal external ear canal volume with normal middle ear pressure and tympanic membrane compliance.    Pure tone Audiometry: Normal hearing from  125 Hz - 8000 Hz, in both ears.  Speech Audiometry: Right ear- Speech Reception Threshold (SRT) was obtained at 5 dBHL. Left ear-Speech Reception Threshold (SRT) was obtained at 5 dBHL.   Word Recognition Score Tested using NU-6 (recorded) Right ear: 100% was obtained at a presentation level of 50 dBHL with contralateral masking which is deemed as  excellent. Left ear: 100% was obtained at a presentation level of 50 dBHL with contralateral masking  which is deemed as  excellent.   The hearing test results were completed under headphones and results are deemed to be of good reliability. Test technique:  conventional    Recommendations: Follow up with ENT as scheduled for today. Return for a hearing evaluation if concerns with hearing changes arise or per MD recommendation.   Deserea Bordley MARIE LEROUX-MARTINEZ, AUD

## 2024-03-12 ENCOUNTER — Encounter: Payer: Self-pay | Admitting: Audiology

## 2024-04-28 ENCOUNTER — Ambulatory Visit: Payer: Self-pay | Admitting: Family Medicine

## 2024-04-28 ENCOUNTER — Ambulatory Visit: Payer: PRIVATE HEALTH INSURANCE | Admitting: Family Medicine

## 2024-04-28 ENCOUNTER — Encounter: Payer: Self-pay | Admitting: Family Medicine

## 2024-04-28 VITALS — BP 120/74 | HR 83 | Temp 98.0°F | Resp 16 | Ht 64.0 in | Wt 197.2 lb

## 2024-04-28 DIAGNOSIS — Z23 Encounter for immunization: Secondary | ICD-10-CM | POA: Diagnosis not present

## 2024-04-28 DIAGNOSIS — N3946 Mixed incontinence: Secondary | ICD-10-CM

## 2024-04-28 DIAGNOSIS — L409 Psoriasis, unspecified: Secondary | ICD-10-CM | POA: Diagnosis not present

## 2024-04-28 DIAGNOSIS — Z0001 Encounter for general adult medical examination with abnormal findings: Secondary | ICD-10-CM | POA: Diagnosis not present

## 2024-04-28 DIAGNOSIS — M79641 Pain in right hand: Secondary | ICD-10-CM

## 2024-04-28 DIAGNOSIS — M79642 Pain in left hand: Secondary | ICD-10-CM

## 2024-04-28 DIAGNOSIS — Z0000 Encounter for general adult medical examination without abnormal findings: Secondary | ICD-10-CM

## 2024-04-28 LAB — LIPID PANEL
Cholesterol: 194 mg/dL (ref 0–200)
HDL: 53.1 mg/dL (ref >39.00)
LDL Cholesterol: 125 mg/dL — ABNORMAL HIGH (ref 0–99)
NonHDL: 141.13
Total CHOL/HDL Ratio: 4
Triglycerides: 79 mg/dL (ref 0.0–149.0)
VLDL: 15.8 mg/dL (ref 0.0–40.0)

## 2024-04-28 LAB — COMPREHENSIVE METABOLIC PANEL WITH GFR
ALT: 14 U/L (ref 0–35)
AST: 11 U/L (ref 0–37)
Albumin: 4.5 g/dL (ref 3.5–5.2)
Alkaline Phosphatase: 71 U/L (ref 39–117)
BUN: 12 mg/dL (ref 6–23)
CO2: 27 meq/L (ref 19–32)
Calcium: 9.3 mg/dL (ref 8.4–10.5)
Chloride: 103 meq/L (ref 96–112)
Creatinine, Ser: 0.97 mg/dL (ref 0.40–1.20)
GFR: 65.82 mL/min (ref >60.00)
Glucose, Bld: 81 mg/dL (ref 70–99)
Potassium: 4.2 meq/L (ref 3.5–5.1)
Sodium: 138 meq/L (ref 135–145)
Total Bilirubin: 0.6 mg/dL (ref 0.2–1.2)
Total Protein: 6.9 g/dL (ref 6.0–8.3)

## 2024-04-28 LAB — CBC
HCT: 42.8 % (ref 36.0–46.0)
Hemoglobin: 14.3 g/dL (ref 12.0–15.0)
MCHC: 33.3 g/dL (ref 30.0–36.0)
MCV: 93.4 fl (ref 78.0–100.0)
Platelets: 286 K/uL (ref 150.0–400.0)
RBC: 4.58 Mil/uL (ref 3.87–5.11)
RDW: 14 % (ref 11.5–15.5)
WBC: 5.2 K/uL (ref 4.0–10.5)

## 2024-04-28 MED ORDER — MIRABEGRON ER 25 MG PO TB24: 25.0000 mg | ORAL_TABLET | Freq: Every day | ORAL | 1 refills | Status: DC

## 2024-04-28 NOTE — Progress Notes (Signed)
 Chief Complaint  Patient presents with   Annual Exam    CPE     Well Woman Hannah Keith is here for a complete physical.   Her last physical was >1 year ago.  Current diet: in general, diet is improving.  Current exercise: none. Weight is stable and she denies fatigue out of ordinary. Seatbelt? Yes Advanced directive? No  Health Maintenance Pap/HPV- Yes Mammogram- Yes Colon cancer screening-Yes Shingrix- Yes Tetanus- Yes Hep C screening- Yes HIV screening- Yes  For a little over a year, she has had urinary frequency and urgency.  Sometimes she will lose control of her urine to a small extent.  Sometimes she will cough and leak urine.  There is no pain, bleeding, or discharge.  Bowel movements are normal.  She has never tried a medication for this.  She is not on a diuretic.  Past Medical History:  Diagnosis Date   Anxiety    Depression    Emphysema lung (HCC)    History of chicken pox    Panic attacks    PTSD (post-traumatic stress disorder)    Vaginal Pap smear, abnormal      Past Surgical History:  Procedure Laterality Date   LEEP  2001-2002    Medications  Current Outpatient Medications on File Prior to Visit  Medication Sig Dispense Refill   acetaminophen (TYLENOL) 325 MG tablet Take 650 mg by mouth every 6 (six) hours as needed.     amphetamine-dextroamphetamine (ADDERALL) 20 MG tablet Take 20 mg by mouth 3 (three) times daily.     apixaban  (ELIQUIS ) 5 MG TABS tablet Take 1 tablet (5 mg total) by mouth 2 (two) times daily. Start taking after completion of starter pack. 90 tablet 1   APIXABAN  (ELIQUIS ) VTE STARTER PACK (10MG  AND 5MG ) Take as directed on package: start with two-5mg  tablets twice daily for 7 days. On day 8, switch to one-5mg  tablet twice daily. 74 each 0   citalopram  (CELEXA ) 20 MG tablet Take 20 mg by mouth daily.     clobetasol ointment (TEMOVATE) 0.05 % Apply 1 Application topically 2 (two) times daily.     clonazePAM  (KLONOPIN ) 0.5 MG tablet  Take 1 tablet (0.5 mg total) by mouth 2 (two) times daily as needed for anxiety. 10 tablet 1   traZODone (DESYREL) 50 MG tablet Take 50 mg by mouth at bedtime as needed for sleep.     Allergies Allergies  Allergen Reactions   Nsaids     Cause GI Issues in large amounts   Penicillins Rash    Review of Systems: Constitutional:  no unexpected weight changes Eye:  no recent significant change in vision Ear/Nose/Mouth/Throat:  Ears:  no recent change in hearing Nose/Mouth/Throat:  no complaints of nasal congestion, no sore throat Cardiovascular: no chest pain Respiratory:  no shortness of breath Gastrointestinal:  no abdominal pain, no change in bowel habits GU:  Female: As noted in HPI.  Negative for dysuria or pelvic pain Musculoskeletal/Extremities: +hand pain Integumentary (Skin/Breast):  +rash of feet and under fingernails (psoriasis) Neurologic:  no headaches Endocrine:  denies fatigue  Exam BP 120/74 (BP Location: Left Arm, Patient Position: Sitting)   Pulse 83   Temp 98 F (36.7 C) (Oral)   Resp 16   Ht 5' 4 (1.626 m)   Wt 197 lb 3.2 oz (89.4 kg)   SpO2 95%   BMI 33.85 kg/m  General:  well developed, well nourished, in no apparent distress Skin: Peeling of skin on both  feet.  Otherwise, no significant moles, warts, or growths on exposed skin Head:  no masses, lesions, or tenderness Eyes:  pupils equal and round, sclera anicteric without injection Ears:  canals without lesions, TMs shiny without retraction, no obvious effusion, no erythema Nose:  nares patent, mucosa normal, and no drainage  Throat/Pharynx:  lips and gingiva without lesion; tongue and uvula midline; non-inflamed pharynx; no exudates or postnasal drainage Neck: neck supple without adenopathy, thyromegaly, or masses Lungs:  clear to auscultation, breath sounds equal bilaterally, no respiratory distress Cardio:  regular rate and rhythm, no LE edema Abdomen:  abdomen soft, nontender; bowel sounds normal;  no masses or organomegaly Genital: Defer to GYN Musculoskeletal:  symmetrical muscle groups noted without atrophy or deformity Extremities:  no clubbing, cyanosis, or edema, no deformities, no skin discoloration Neuro:  gait normal; deep tendon reflexes normal and symmetric Psych: well oriented with normal range of affect and appropriate judgment/insight  Assessment and Plan  Well adult exam - Plan: CBC, Comprehensive metabolic panel with GFR, Hepatitis B surface antibody,quantitative, Lipid panel  Psoriasis - Plan: Ambulatory referral to Dermatology  Pain in both hands - Plan: Ambulatory referral to Dermatology  Mixed incontinence - Plan: mirabegron ER (MYRBETRIQ) 25 MG TB24 tablet  Need for influenza vaccination - Plan: Flu vaccine trivalent PF, 6mos and older(Flulaval,Afluria,Fluarix,Fluzone)  Pneumococcal vaccination given - Plan: Pneumococcal conjugate vaccine 20-valent (Prevnar 20)   Well 53 y.o. female. Counseled on diet and exercise. Advanced directive form provided today.  Flu shot today. PCV20 today.   Screen hep B. Mixed incontinence: Chronic, not controlled.  Start Myrbetriq 25 mg daily.  Mind fluid intake.  Follow-up in 1 month to recheck.  Could consider referral to urology versus increasing dosage depending on how she does. Other orders as above. The patient voiced understanding and agreement to the plan.  Mabel [place], DO 04/28/24 10:03 AM

## 2024-04-28 NOTE — Patient Instructions (Signed)
 Give [place] 2-3 business days to get the results of your labs back.   Keep the diet clean and stay active.  Please get me a copy of your advanced directive form at your convenience.   Let us know if you need anything.

## 2024-04-29 LAB — HEPATITIS B SURFACE ANTIBODY, QUANTITATIVE: Hep B S AB Quant (Post): <5 m[IU]/mL — ABNORMAL LOW (ref >=10)

## 2024-05-03 ENCOUNTER — Other Ambulatory Visit: Payer: Self-pay | Admitting: Family

## 2024-05-03 ENCOUNTER — Ambulatory Visit (HOSPITAL_BASED_OUTPATIENT_CLINIC_OR_DEPARTMENT_OTHER)
Admission: RE | Admit: 2024-05-03 | Discharge: 2024-05-03 | Disposition: A | Discharge status: Home / Self Care | Payer: PRIVATE HEALTH INSURANCE | Source: Ambulatory Visit | Attending: Family | Admitting: Family

## 2024-05-03 ENCOUNTER — Inpatient Hospital Stay: Payer: PRIVATE HEALTH INSURANCE | Admitting: Family

## 2024-05-03 ENCOUNTER — Encounter: Payer: Self-pay | Admitting: Family

## 2024-05-03 ENCOUNTER — Inpatient Hospital Stay: Payer: PRIVATE HEALTH INSURANCE | Attending: Family

## 2024-05-03 VITALS — BP 96/65 | HR 70 | Temp 98.8°F | Resp 18 | Ht 64.0 in | Wt 193.1 lb

## 2024-05-03 DIAGNOSIS — D6859 Other primary thrombophilia: Secondary | ICD-10-CM

## 2024-05-03 DIAGNOSIS — I82412 Acute embolism and thrombosis of left femoral vein: Secondary | ICD-10-CM

## 2024-05-03 DIAGNOSIS — I82431 Acute embolism and thrombosis of right popliteal vein: Secondary | ICD-10-CM

## 2024-05-03 DIAGNOSIS — F17200 Nicotine dependence, unspecified, uncomplicated: Secondary | ICD-10-CM | POA: Insufficient documentation

## 2024-05-03 DIAGNOSIS — Z7901 Long term (current) use of anticoagulants: Secondary | ICD-10-CM | POA: Diagnosis not present

## 2024-05-03 LAB — CMP (CANCER CENTER ONLY)
ALT: 14 U/L (ref 0–44)
AST: 14 U/L — ABNORMAL LOW (ref 15–41)
Albumin: 4.4 g/dL (ref 3.5–5.0)
Alkaline Phosphatase: 79 U/L (ref 38–126)
Anion gap: 10 (ref 5–15)
BUN: 10 mg/dL (ref 6–20)
CO2: 22 mmol/L (ref 22–32)
Calcium: 9.4 mg/dL (ref 8.9–10.3)
Chloride: 109 mmol/L (ref 98–111)
Creatinine: 0.83 mg/dL (ref 0.44–1.00)
GFR, Estimated: >60 mL/min (ref >60)
Glucose, Bld: 100 mg/dL — ABNORMAL HIGH (ref 70–99)
Potassium: 4.2 mmol/L (ref 3.5–5.1)
Sodium: 140 mmol/L (ref 135–145)
Total Bilirubin: 0.2 mg/dL (ref 0.0–1.2)
Total Protein: 7.3 g/dL (ref 6.5–8.1)

## 2024-05-03 LAB — CBC WITH DIFFERENTIAL (CANCER CENTER ONLY)
Abs Immature Granulocytes: 0.02 K/uL (ref 0.00–0.07)
Basophils Absolute: 0.1 K/uL (ref 0.0–0.1)
Basophils Relative: 1 %
Eosinophils Absolute: 0.1 K/uL (ref 0.0–0.5)
Eosinophils Relative: 1 %
HCT: 43.4 % (ref 36.0–46.0)
Hemoglobin: 14.9 g/dL (ref 12.0–15.0)
Immature Granulocytes: 0 %
Lymphocytes Relative: 34 %
Lymphs Abs: 2.4 K/uL (ref 0.7–4.0)
MCH: 31.3 pg (ref 26.0–34.0)
MCHC: 34.3 g/dL (ref 30.0–36.0)
MCV: 91.2 fL (ref 80.0–100.0)
Monocytes Absolute: 0.5 K/uL (ref 0.1–1.0)
Monocytes Relative: 6 %
Neutro Abs: 4.1 K/uL (ref 1.7–7.7)
Neutrophils Relative %: 58 %
Platelet Count: 298 K/uL (ref 150–400)
RBC: 4.76 MIL/uL (ref 3.87–5.11)
RDW: 13.4 % (ref 11.5–15.5)
WBC Count: 7.2 K/uL (ref 4.0–10.5)
nRBC: 0 % (ref 0.0–0.2)

## 2024-05-03 LAB — LACTATE DEHYDROGENASE: LDH: 166 U/L (ref 98–192)

## 2024-05-03 NOTE — Progress Notes (Signed)
 Hematology and Oncology Follow Up Visit  Hannah Keith [id number] 1971-06-30 53 y.o. 05/03/2024   Principle Diagnosis:  History of RLE DVT dx 10/2021 - resolved  New LLE DVT dx 01/2024  Hyper coag panel negative  Current Therapy:   Eliquis  5 mg PO BID   Interim History:  Hannah Keith is here today for follow-up. She is doing well. There has been a little stress at home but she is working through it. She has started smoking again but trying to wean back off. She is on Chantix as well as Zepbound.  No fever, chills, n/v, cough, rash, dizziness, SOB, chest pain, palpitations, abdominal pain or changes in bowel or bladder habits.  No swelling, numbness or tingling in her extremities.  No falls or syncope reported.  US  today showed resolution of LLE DVT.  She is tolerating Eliquis  nicely and taking as prescribed.  No abnormal blood loss, bruising or petechiae.  Appetite comes and goes on Zepbound and hydration is good. Weight is stable at 193 lbs.   ECOG Performance Status: 1 - Symptomatic but completely ambulatory  Medications:  Allergies as of 05/03/2024       Reactions   Nsaids    Cause GI Issues in large amounts   Penicillins Rash        Medication List        Accurate as of May 03, 2024 11:16 AM. If you have any questions, ask your nurse or doctor.          acetaminophen 325 MG tablet Commonly known as: TYLENOL Take 650 mg by mouth every 6 (six) hours as needed.   amphetamine-dextroamphetamine 20 MG tablet Commonly known as: ADDERALL Take 20 mg by mouth 3 (three) times daily.   apixaban  5 MG Tabs tablet Commonly known as: Eliquis  Take 1 tablet (5 mg total) by mouth 2 (two) times daily. Start taking after completion of starter pack.   citalopram  20 MG tablet Commonly known as: CELEXA  Take 20 mg by mouth daily.   clobetasol ointment 0.05 % Commonly known as: TEMOVATE Apply 1 Application topically 2 (two) times daily.   clonazePAM  0.5 MG  tablet Commonly known as: KLONOPIN  Take 1 tablet (0.5 mg total) by mouth 2 (two) times daily as needed for anxiety.   mirabegron ER 25 MG Tb24 tablet Commonly known as: MYRBETRIQ Take 1 tablet (25 mg total) by mouth daily.   traZODone 50 MG tablet Commonly known as: DESYREL Take 50 mg by mouth at bedtime as needed for sleep.        Allergies:  Allergies  Allergen Reactions   Nsaids     Cause GI Issues in large amounts   Penicillins Rash    Past Medical History, Surgical history, Social history, and Family History were reviewed and updated.  Review of Systems: All other 10 point review of systems is negative.   Physical Exam:  vitals were not taken for this visit.   Wt Readings from Last 3 Encounters:  04/28/24 197 lb 3.2 oz (89.4 kg)  03/03/24 199 lb 12.8 oz (90.6 kg)  02/05/24 198 lb 12.8 oz (90.2 kg)    Ocular: Sclerae unicteric, pupils equal, round and reactive to light Ear-nose-throat: Oropharynx clear, dentition fair Lymphatic: No cervical or supraclavicular adenopathy Lungs no rales or rhonchi, good excursion bilaterally Heart regular rate and rhythm, no murmur appreciated Abd soft, nontender, positive bowel sounds MSK no focal spinal tenderness, no joint edema Neuro: non-focal, well-oriented, appropriate affect Breasts: Deferred   Lab Results  Component Value Date   WBC 5.2 04/28/2024   HGB 14.3 04/28/2024   HCT 42.8 04/28/2024   MCV 93.4 04/28/2024   PLT 286.0 04/28/2024   No results found for: FERRITIN, IRON, TIBC, UIBC, IRONPCTSAT Lab Results  Component Value Date   RBC 4.58 04/28/2024   No results found for: KPAFRELGTCHN, LAMBDASER, KAPLAMBRATIO No results found for: IGGSERUM, IGA, IGMSERUM No results found for: STEPHANY CARLOTA BENSON MARKEL EARLA JOANNIE DOC VICK, SPEI   Chemistry      Component Value Date/Time   NA 138 04/28/2024 0932   K 4.2 04/28/2024 0932   CL 103 04/28/2024 0932    CO2 27 04/28/2024 0932   BUN 12 04/28/2024 0932   CREATININE 0.97 04/28/2024 0932   CREATININE 0.93 03/03/2024 1521   CREATININE 0.80 04/21/2020 0941      Component Value Date/Time   CALCIUM 9.3 04/28/2024 0932   ALKPHOS 71 04/28/2024 0932   AST 11 04/28/2024 0932   AST 16 03/03/2024 1521   ALT 14 04/28/2024 0932   ALT 18 03/03/2024 1521   BILITOT 0.6 04/28/2024 0932   BILITOT 0.3 03/03/2024 1521       Impression and Plan: Hannah Keith is a very pleasant 53 yo female with history right lower extremity DVT of the popliteal vein diagnosed in 2023 and now a new LLE DVT diagnosed in July 2025 secondary to smoking/varicose veins and PVD.  Hyper coag panel was negative.  Repeat US  showed resolution of DVT today.  Continue same regimen with Eliquis  5 mg PO BID.  Follow-up in 6 months.   Lauraine Pepper, NP 10/27/202511:16 AM

## 2024-05-21 ENCOUNTER — Other Ambulatory Visit: Payer: Self-pay | Admitting: *Deleted

## 2024-05-21 DIAGNOSIS — I82412 Acute embolism and thrombosis of left femoral vein: Secondary | ICD-10-CM

## 2024-05-21 MED ORDER — APIXABAN 5 MG PO TABS: 5.0000 mg | ORAL_TABLET | Freq: Two times a day (BID) | ORAL | 0 refills | Status: AC

## 2024-05-31 ENCOUNTER — Ambulatory Visit (INDEPENDENT_AMBULATORY_CARE_PROVIDER_SITE_OTHER): Payer: PRIVATE HEALTH INSURANCE | Admitting: Family Medicine

## 2024-05-31 ENCOUNTER — Encounter: Payer: Self-pay | Admitting: Family Medicine

## 2024-05-31 VITALS — BP 118/78 | HR 83 | Temp 98.0°F | Resp 16 | Ht 64.0 in | Wt 189.2 lb

## 2024-05-31 DIAGNOSIS — N3946 Mixed incontinence: Secondary | ICD-10-CM | POA: Diagnosis not present

## 2024-05-31 MED ORDER — MIRABEGRON ER 25 MG PO TB24: 25.0000 mg | ORAL_TABLET | Freq: Every day | ORAL | 3 refills | Status: AC

## 2024-05-31 NOTE — Addendum Note (Signed)
 Addended by: FRANN MABEL SQUIBB on: 05/31/2024 11:10 AM   Modules accepted: Orders

## 2024-05-31 NOTE — Patient Instructions (Signed)
 Pepcid/famotidine 20 mg 1-2 times daily can help with reflux symptoms.   The only lifestyle changes that have data behind them are weight loss for the overweight/obese and elevating the head of the bed. Finding out which foods/positions are triggers is important.  Let us know if you need anything.

## 2024-05-31 NOTE — Progress Notes (Signed)
 Chief Complaint  Patient presents with   Medication Refill    Medication Check    Subjective: Patient is a 53 y.o. female here for OAB/incontinence.  Started on Myrbetriq  25 mg/d. Compliant, mostly no AE's. May have had more reflux symptoms since then. Not having incontinence or overactive bladder consistently since starting the medication.   Past Medical History:  Diagnosis Date   Anxiety    Depression    Emphysema lung (HCC)    History of chicken pox    Panic attacks    PTSD (post-traumatic stress disorder)    Vaginal Pap smear, abnormal     Objective: BP 118/78 (BP Location: Left Arm, Patient Position: Sitting)   Pulse 83   Temp 98 F (36.7 C) (Oral)   Resp 16   Ht 5' 4 (1.626 m)   Wt 189 lb 3.2 oz (85.8 kg)   SpO2 98%   BMI 32.48 kg/m  General: Awake, appears stated age Heart: RRR, no LE edema Lungs: CTAB, no rales, wheezes or rhonchi. No accessory muscle use Abd: BS+, S, NT, ND Psych: Age appropriate judgment and insight, normal affect and mood  Assessment and Plan: Mixed incontinence  Chronic, stable. Cont Myrbetriq  25 mg/d. Pepcid prn for reflux s/s's. Could be from [place].  F/u in 6 mo. The patient voiced understanding and agreement to the plan.  Mabel Mt [place], DO 05/31/24  11:08 AM

## 2024-07-05 ENCOUNTER — Ambulatory Visit (HOSPITAL_BASED_OUTPATIENT_CLINIC_OR_DEPARTMENT_OTHER)
Admission: RE | Admit: 2024-07-05 | Discharge: 2024-07-05 | Disposition: A | Discharge status: Home / Self Care | Payer: PRIVATE HEALTH INSURANCE | Source: Ambulatory Visit | Attending: *Deleted | Admitting: *Deleted

## 2024-07-05 DIAGNOSIS — Z122 Encounter for screening for malignant neoplasm of respiratory organs: Secondary | ICD-10-CM | POA: Diagnosis present

## 2024-07-05 DIAGNOSIS — F17210 Nicotine dependence, cigarettes, uncomplicated: Secondary | ICD-10-CM | POA: Diagnosis not present

## 2024-07-05 DIAGNOSIS — Z87891 Personal history of nicotine dependence: Secondary | ICD-10-CM

## 2024-07-15 ENCOUNTER — Other Ambulatory Visit: Payer: Self-pay | Admitting: Acute Care

## 2024-07-15 DIAGNOSIS — Z87891 Personal history of nicotine dependence: Secondary | ICD-10-CM

## 2024-07-15 DIAGNOSIS — F17210 Nicotine dependence, cigarettes, uncomplicated: Secondary | ICD-10-CM

## 2024-07-15 DIAGNOSIS — Z122 Encounter for screening for malignant neoplasm of respiratory organs: Secondary | ICD-10-CM

## 2024-08-11 ENCOUNTER — Ambulatory Visit: Payer: PRIVATE HEALTH INSURANCE | Admitting: Dermatology

## 2024-11-01 ENCOUNTER — Inpatient Hospital Stay: Payer: PRIVATE HEALTH INSURANCE

## 2024-11-01 ENCOUNTER — Inpatient Hospital Stay: Payer: PRIVATE HEALTH INSURANCE | Admitting: Family

## 2024-11-30 ENCOUNTER — Ambulatory Visit: Payer: PRIVATE HEALTH INSURANCE | Admitting: Family Medicine
